# Patient Record
Sex: Female | Born: 1995
Health system: Southern US, Community
[De-identification: ages and names within clinical notes are randomized; demographics above are authoritative.]

## PROBLEM LIST (undated history)

## (undated) DIAGNOSIS — D573 Sickle-cell trait: Secondary | ICD-10-CM

---

## 2013-02-25 ENCOUNTER — Emergency Department (HOSPITAL_BASED_OUTPATIENT_CLINIC_OR_DEPARTMENT_OTHER)
Admission: EM | Admit: 2013-02-25 | Discharge: 2013-02-25 | Disposition: A | Discharge status: Home / Self Care | Payer: Medicaid Other | Attending: Emergency Medicine | Admitting: Emergency Medicine

## 2013-02-25 ENCOUNTER — Encounter (HOSPITAL_BASED_OUTPATIENT_CLINIC_OR_DEPARTMENT_OTHER): Payer: Self-pay | Admitting: Emergency Medicine

## 2013-02-25 DIAGNOSIS — Z862 Personal history of diseases of the blood and blood-forming organs and certain disorders involving the immune mechanism: Secondary | ICD-10-CM | POA: Insufficient documentation

## 2013-02-25 DIAGNOSIS — Z3202 Encounter for pregnancy test, result negative: Secondary | ICD-10-CM | POA: Insufficient documentation

## 2013-02-25 DIAGNOSIS — Z792 Long term (current) use of antibiotics: Secondary | ICD-10-CM | POA: Insufficient documentation

## 2013-02-25 DIAGNOSIS — N390 Urinary tract infection, site not specified: Secondary | ICD-10-CM | POA: Insufficient documentation

## 2013-02-25 DIAGNOSIS — F17200 Nicotine dependence, unspecified, uncomplicated: Secondary | ICD-10-CM | POA: Insufficient documentation

## 2013-02-25 HISTORY — DX: Sickle-cell trait: D57.3

## 2013-02-25 LAB — URINALYSIS, ROUTINE W REFLEX MICROSCOPIC
Bilirubin Urine: NEGATIVE
Nitrite: NEGATIVE
Specific Gravity, Urine: 1.016 (ref 1.005–1.030)
Urobilinogen, UA: 0.2 mg/dL (ref 0.0–1.0)
pH: 5.5 (ref 5.0–8.0)

## 2013-02-25 LAB — WET PREP, GENITAL
Clue Cells Wet Prep HPF POC: NONE SEEN
Trich, Wet Prep: NONE SEEN
Yeast Wet Prep HPF POC: NONE SEEN

## 2013-02-25 LAB — URINE MICROSCOPIC-ADD ON

## 2013-02-25 LAB — PREGNANCY, URINE: Preg Test, Ur: NEGATIVE

## 2013-02-25 MED ORDER — CEPHALEXIN 250 MG PO CAPS
250.0000 mg | ORAL_CAPSULE | Freq: Once | ORAL | Status: AC
  Administered 2013-02-25: 250 mg via ORAL
  Filled 2013-02-25: qty 1

## 2013-02-25 MED ORDER — CEPHALEXIN 500 MG PO CAPS: 500.0000 mg | ORAL_CAPSULE | Freq: Four times a day (QID) | ORAL | Status: DC

## 2013-02-25 NOTE — ED Notes (Signed)
PA at bedside.

## 2013-02-25 NOTE — ED Notes (Signed)
Pt reports she took Plan B last week and has developed vaginal itching and frequency with urination.

## 2013-02-25 NOTE — ED Provider Notes (Signed)
CSN: [id number]     Arrival date & time 02/25/13  1236 History   First MD Initiated Contact with Patient 02/25/13 1256     Chief Complaint  Patient presents with  . Vaginal Itching  . Urinary Frequency   (Consider location/radiation/quality/duration/timing/severity/associated sxs/prior Treatment) Patient is a 17 y.o. female presenting with vaginal itching and frequency. The history is provided by the patient. No language interpreter was used.  Vaginal Itching Pertinent negatives include no abdominal pain, chest pain, fever, nausea or vomiting. Associated symptoms comments: She has been having a burning type pain to the external vaginal area for 2-3 days. She had itching initially. She reports frequency of urination with burning also. No fever, nausea, abnormal vaginal bleeding or vaginal discharge today. She states she took a 'morning after' pill 4 days ago and had some vaginal discharge the following morning that resolved later that day. .  Urinary Frequency Pertinent negatives include no abdominal pain, chest pain, fever, nausea or vomiting.    Past Medical History  Diagnosis Date  . Sickle cell trait    History reviewed. No pertinent past surgical history. No family history on file. History  Substance Use Topics  . Smoking status: Current Every Day Smoker  . Smokeless tobacco: Not on file  . Alcohol Use: No   OB History   Grav Para Term Preterm Abortions TAB SAB Ect Mult Living                 Review of Systems  Constitutional: Negative for fever.  Respiratory: Negative for shortness of breath.   Cardiovascular: Negative for chest pain.  Gastrointestinal: Negative for nausea, vomiting and abdominal pain.  Genitourinary: Positive for dysuria, frequency and vaginal pain. Negative for vaginal bleeding.       See HPI.    Allergies  Review of patient's allergies indicates no known allergies.  Home Medications   Current Outpatient Rx  Name  Route  Sig  Dispense  Refill   . cephALEXin (KEFLEX) 500 MG capsule   Oral   Take 1 capsule (500 mg total) by mouth 4 (four) times daily.   20 capsule   0    BP 127/79  Pulse 77  Temp(Src) 98.3 F (36.8 C) (Oral)  Resp 18  Ht 5\' 6"  (1.676 m)  Wt 153 lb (69.4 kg)  BMI 24.71 kg/m2  SpO2 100%  LMP 02/10/2013 Physical Exam  Constitutional: She is oriented to person, place, and time. She appears well-developed and well-nourished.  HENT:  Head: Normocephalic.  Neck: Normal range of motion. Neck supple.  Cardiovascular: Normal rate and regular rhythm.   Pulmonary/Chest: Effort normal and breath sounds normal.  Abdominal: Soft. Bowel sounds are normal. There is no tenderness. There is no rebound and no guarding.  Genitourinary: Vagina normal and uterus normal. No vaginal discharge found.  Minimal vaginal discharge present that is white, thick. No CMT. No adnexal mass or tenderness.  Musculoskeletal: Normal range of motion.  Neurological: She is alert and oriented to person, place, and time.  Skin: Skin is warm and dry. No rash noted.  Psychiatric: She has a normal mood and affect.    ED Course  Procedures (including critical care time) Labs Review Labs Reviewed  WET PREP, GENITAL - Abnormal; Notable for the following:    WBC, Wet Prep HPF POC MANY (*)    All other components within normal limits  URINALYSIS, ROUTINE W REFLEX MICROSCOPIC - Abnormal; Notable for the following:    APPearance CLOUDY (*)  Leukocytes, UA LARGE (*)    All other components within normal limits  URINE MICROSCOPIC-ADD ON - Abnormal; Notable for the following:    Squamous Epithelial / LPF MANY (*)    Bacteria, UA MANY (*)    All other components within normal limits  GC/CHLAMYDIA PROBE AMP  URINE CULTURE  PREGNANCY, URINE   Results for orders placed during the hospital encounter of 02/25/13  WET PREP, GENITAL      Result Value Range   Yeast Wet Prep HPF POC NONE SEEN  NONE SEEN   Trich, Wet Prep NONE SEEN  NONE SEEN    Clue Cells Wet Prep HPF POC NONE SEEN  NONE SEEN   WBC, Wet Prep HPF POC MANY (*) NONE SEEN  PREGNANCY, URINE      Result Value Range   Preg Test, Ur NEGATIVE  NEGATIVE  URINALYSIS, ROUTINE W REFLEX MICROSCOPIC      Result Value Range   Color, Urine YELLOW  YELLOW   APPearance CLOUDY (*) CLEAR   Specific Gravity, Urine 1.016  1.005 - 1.030   pH 5.5  5.0 - 8.0   Glucose, UA NEGATIVE  NEGATIVE mg/dL   Hgb urine dipstick NEGATIVE  NEGATIVE   Bilirubin Urine NEGATIVE  NEGATIVE   Ketones, ur NEGATIVE  NEGATIVE mg/dL   Protein, ur NEGATIVE  NEGATIVE mg/dL   Urobilinogen, UA 0.2  0.0 - 1.0 mg/dL   Nitrite NEGATIVE  NEGATIVE   Leukocytes, UA LARGE (*) NEGATIVE  URINE MICROSCOPIC-ADD ON      Result Value Range   Squamous Epithelial / LPF MANY (*) RARE   WBC, UA 11-20  <3 WBC/hpf   RBC / HPF 0-2  <3 RBC/hpf   Bacteria, UA MANY (*) RARE    Imaging Review No results found.  EKG Interpretation   None       MDM   1. UTI (lower urinary tract infection)    She is well appearing, no significant finding on GYN exam with evidence of UTI. Cultures pending. Treated with Keflex.    Arnoldo Hooker, PA-C 02/25/13 1425

## 2013-02-26 LAB — URINE CULTURE
Colony Count: NO GROWTH
Culture: NO GROWTH

## 2013-02-26 LAB — GC/CHLAMYDIA PROBE AMP
CT Probe RNA: NEGATIVE
GC Probe RNA: NEGATIVE

## 2013-02-26 NOTE — ED Provider Notes (Signed)
Medical screening examination/treatment/procedure(s) were performed by non-physician practitioner and as supervising physician I was immediately available for consultation/collaboration.  EKG Interpretation   None         Charles B. Sheldon, MD 02/26/13 0704 

## 2013-12-16 ENCOUNTER — Encounter (HOSPITAL_BASED_OUTPATIENT_CLINIC_OR_DEPARTMENT_OTHER): Payer: Self-pay | Admitting: Emergency Medicine

## 2013-12-16 ENCOUNTER — Emergency Department (HOSPITAL_BASED_OUTPATIENT_CLINIC_OR_DEPARTMENT_OTHER)
Admission: EM | Admit: 2013-12-16 | Discharge: 2013-12-16 | Disposition: A | Discharge status: Home / Self Care | Payer: Medicaid Other | Attending: Emergency Medicine | Admitting: Emergency Medicine

## 2013-12-16 DIAGNOSIS — Z792 Long term (current) use of antibiotics: Secondary | ICD-10-CM | POA: Diagnosis not present

## 2013-12-16 DIAGNOSIS — Z3202 Encounter for pregnancy test, result negative: Secondary | ICD-10-CM | POA: Diagnosis not present

## 2013-12-16 DIAGNOSIS — R110 Nausea: Secondary | ICD-10-CM | POA: Diagnosis not present

## 2013-12-16 DIAGNOSIS — A5901 Trichomonal vulvovaginitis: Secondary | ICD-10-CM | POA: Insufficient documentation

## 2013-12-16 DIAGNOSIS — B9689 Other specified bacterial agents as the cause of diseases classified elsewhere: Secondary | ICD-10-CM

## 2013-12-16 DIAGNOSIS — A599 Trichomoniasis, unspecified: Secondary | ICD-10-CM

## 2013-12-16 DIAGNOSIS — Z862 Personal history of diseases of the blood and blood-forming organs and certain disorders involving the immune mechanism: Secondary | ICD-10-CM | POA: Diagnosis not present

## 2013-12-16 DIAGNOSIS — Z720 Tobacco use: Secondary | ICD-10-CM | POA: Insufficient documentation

## 2013-12-16 DIAGNOSIS — N760 Acute vaginitis: Secondary | ICD-10-CM | POA: Insufficient documentation

## 2013-12-16 DIAGNOSIS — R300 Dysuria: Secondary | ICD-10-CM | POA: Diagnosis present

## 2013-12-16 LAB — WET PREP, GENITAL: YEAST WET PREP: NONE SEEN

## 2013-12-16 LAB — URINALYSIS, ROUTINE W REFLEX MICROSCOPIC
Bilirubin Urine: NEGATIVE
Glucose, UA: NEGATIVE mg/dL
Hgb urine dipstick: NEGATIVE
KETONES UR: 15 mg/dL — AB
Nitrite: NEGATIVE
Protein, ur: NEGATIVE mg/dL
Specific Gravity, Urine: 1.019 (ref 1.005–1.030)
UROBILINOGEN UA: 2 mg/dL — AB (ref 0.0–1.0)
pH: 6 (ref 5.0–8.0)

## 2013-12-16 LAB — PREGNANCY, URINE: Preg Test, Ur: NEGATIVE

## 2013-12-16 LAB — URINE MICROSCOPIC-ADD ON

## 2013-12-16 MED ORDER — METRONIDAZOLE 500 MG PO TABS
2000.0000 mg | ORAL_TABLET | ORAL | Status: AC
  Administered 2013-12-16: 2000 mg via ORAL
  Filled 2013-12-16: qty 4

## 2013-12-16 NOTE — ED Notes (Signed)
NP at bedside discussing test results and dispo plan of care. 

## 2013-12-16 NOTE — ED Notes (Signed)
When EMT in to draw labs, pt states she has test for syphilis and HIV last week at [facility] notified-orders for same d/c'ed

## 2013-12-16 NOTE — ED Provider Notes (Signed)
CSN: [id number]     Arrival date & time 12/16/13  1801 History   First MD Initiated Contact with Patient 12/16/13 1847     Chief Complaint  Patient presents with  . Dysuria   (Consider location/radiation/quality/duration/timing/severity/associated sxs/prior Treatment) HPI Rebecca Delgado is a 18 yo female presenting with breast tenderness and abdominal cramping x 2 weeks.  She states she and her boyfriend are trying to get pregnant and thinks she might be.  Her LMP was 3 weeks ago.  She also states she gets frequent urinary tract infections and she usually has lower abdominal pain with these infections.  She endorses intermittent nausea but no vomiting.  She is currently not having any abd pain.  She denies fever, chills, back pain or dysuria at anytime.    Past Medical History  Diagnosis Date  . Sickle cell trait    History reviewed. No pertinent past surgical history. History reviewed. No pertinent family history. History  Substance Use Topics  . Smoking status: Current Every Day Smoker  . Smokeless tobacco: Not on file  . Alcohol Use: No   OB History   Grav Para Term Preterm Abortions TAB SAB Ect Mult Living                 Review of Systems  Constitutional: Negative for fever and chills.  HENT: Negative for sore throat.   Eyes: Negative for visual disturbance.  Respiratory: Negative for cough and shortness of breath.   Cardiovascular: Negative for chest pain and leg swelling.  Gastrointestinal: Positive for nausea and abdominal pain. Negative for vomiting and diarrhea.  Genitourinary: Negative for dysuria.  Musculoskeletal: Negative for myalgias.  Skin: Negative for rash.  Neurological: Negative for weakness, numbness and headaches.      Allergies  Review of patient's allergies indicates no known allergies.  Home Medications   Prior to Admission medications   Medication Sig Start Date End Date Taking? Authorizing Provider  cephALEXin (KEFLEX) 500 MG capsule  Take 1 capsule (500 mg total) by mouth 4 (four) times daily. 02/25/13   Shari A Upstill, PA-C   BP 120/76  Pulse 70  Temp(Src) 99.2 F (37.3 C)  Resp 16  Ht 5\' 6"  (1.676 m)  Wt 160 lb (72.576 kg)  BMI 25.84 kg/m2  SpO2 100%  LMP 11/27/2013 Physical Exam  Nursing note and vitals reviewed. Constitutional: She appears well-developed and well-nourished. No distress.  HENT:  Head: Normocephalic and atraumatic.  Mouth/Throat: Oropharynx is clear and moist. No oropharyngeal exudate.  Eyes: Conjunctivae are normal.  Neck: Neck supple. No thyromegaly present.  Cardiovascular: Normal rate, regular rhythm and intact distal pulses.   Pulmonary/Chest: Effort normal and breath sounds normal. No respiratory distress. She has no wheezes. She has no rales. She exhibits no tenderness.  Abdominal: Soft. She exhibits no distension and no mass. There is no tenderness. There is no rebound and no guarding.  Genitourinary: Cervix exhibits discharge. Cervix exhibits no motion tenderness. Right adnexum displays no tenderness. Left adnexum displays no tenderness.  Grayinsh-green frothy discharge  Musculoskeletal: She exhibits no tenderness.  Lymphadenopathy:    She has no cervical adenopathy.  Neurological: She is alert.  Skin: Skin is warm and dry. No rash noted. She is not diaphoretic.  Psychiatric: She has a normal mood and affect.    ED Course  Procedures (including critical care time) Labs Review Labs Reviewed  WET PREP, GENITAL - Abnormal; Notable for the following:    Trich, Wet Prep MANY (*)  Clue Cells Wet Prep HPF POC MANY (*)    WBC, Wet Prep HPF POC MANY (*)    All other components within normal limits  URINALYSIS, ROUTINE W REFLEX MICROSCOPIC - Abnormal; Notable for the following:    Color, Urine AMBER (*)    APPearance CLOUDY (*)    Ketones, ur 15 (*)    Urobilinogen, UA 2.0 (*)    Leukocytes, UA MODERATE (*)    All other components within normal limits  URINE MICROSCOPIC-ADD ON  - Abnormal; Notable for the following:    Squamous Epithelial / LPF FEW (*)    Bacteria, UA FEW (*)    All other components within normal limits  GC/CHLAMYDIA PROBE AMP  PREGNANCY, URINE    Imaging Review No results found.   EKG Interpretation None      MDM   Final diagnoses:  Trichimoniasis  Bacterial vaginosis   18 yo female presents with dysuria and tender breasts and thinks she may be pregnant. Upreg is negative. Discussed importance of using protection when sexually active. Pt understands that they have GC/Chlamydia cultures pending and that they will need to inform all sexual partners if results return positive. Pt not concerning for PID because hemodynamically stable and no cervical motion tenderness on pelvic exam. Pt has also been treated with flagyl for Trichimoniasis and Bacterial Vaginosis. Pt has been advised to not drink alcohol while on this medication.  Patient to be discharged with instructions to follow up with OBGYN.  Filed Vitals:   12/16/13 1810  BP: 120/76  Pulse: 70  Temp: 99.2 F (37.3 C)  Resp: 16  Height: 5\' 6"  (1.676 m)  Weight: 160 lb (72.576 kg)  SpO2: 100%   Meds given in ED:  Medications  metroNIDAZOLE (FLAGYL) tablet 2,000 mg (2,000 mg Oral Given 12/16/13 1951)    Discharge Medication List as of 12/16/2013  7:51 PM        Harle BattiestElizabeth Uzair Godley, NP 12/20/13 2006

## 2013-12-16 NOTE — ED Notes (Signed)
Pt c/o painful urination  X 2 days also reprots painful breast and abd cramping. LMP x 3 weeks ago

## 2013-12-16 NOTE — Discharge Instructions (Signed)
Please follow the directions provided. Be sure to followup with the primary care provider referral to ensure you're getting better. You were partner should be treated for the trichomonas infection. Don't hesitate to return for any new concerning or worsening symptoms.  SEEK MEDICAL CARE IF:  You still have symptoms after you finish your medicine.  You develop abdominal pain.  You have pain when you urinate.  You have bleeding after sexual intercourse.  You develop a rash.  Your medicine makes you sick or makes you throw up (vomit).   Emergency Department Resource Guide 1) Find a Doctor and Pay Out of Pocket Although you won't have to find out who is covered by your insurance plan, it is a good idea to ask around and get recommendations. You will then need to call the office and see if the doctor you have chosen will accept you as a new patient and what types of options they offer for patients who are self-pay. Some doctors offer discounts or will set up payment plans for their patients who do not have insurance, but you will need to ask so you aren't surprised when you get to your appointment.  2) Contact Your Local Health Department Not all health departments have doctors that can see patients for sick visits, but many do, so it is worth a call to see if yours does. If you don't know where your local health department is, you can check in your phone book. The CDC also has a tool to help you locate your state's health department, and many state websites also have listings of all of their local health departments.  3) Find a Walk-in Clinic If your illness is not likely to be very severe or complicated, you may want to try a walk in clinic. These are popping up all over the country in pharmacies, drugstores, and shopping centers. They're usually staffed by nurse practitioners or physician assistants that have been trained to treat common illnesses and complaints. They're usually fairly quick and  inexpensive. However, if you have serious medical issues or chronic medical problems, these are probably not your best option.  No Primary Care Doctor: - Call Health Connect at  [phone] - they can help you locate a primary care doctor that  accepts your insurance, provides certain services, etc. - Physician Referral Service- [phone]  Chronic Pain Problems: Organization         Address  Phone   Notes  [facility]  [phone] Patients need to be referred by their primary care doctor.   Medication Assistance: [phone]         Address  Phone   Notes  [facility] [address] [place], [place] [address] [phone] --Must be a resident of [facility] [place] -- Must have NO insurance coverage whatsoever (no Medicaid/ Medicare, etc.) -- The pt. MUST have a primary care doctor that directs their care regularly and follows them in the community   MedAssist  [phone]   [employer]  [phone]    Agencies that provide inexpensive medical care: Organization         Address  Phone   Notes  [facility]  [phone]   [facility]    [phone]   [facility] [address] [place], [place] [address] [phone]   [facility] [address], [place] [phone]   [facility]  [facility]    [phone]   [facility]    [phone]   Community Health and [place] [address], [place] Phone:  [phone], Fax:  [phone] Hours of Operation:  9 am - 6 pm, M-F.  Also accepts Medicaid/Medicare and self-pay.  [facility] for [place], [address], [place] Phone: [phone], Fax: [phone]. Hours of Operation:  8:30 am - 5:30 pm, M-F.  Also accepts Medicaid and self-pay.  [facility] [place] [address] [place] Phone: [phone]   [place], [place], [place] [phone], Ext. [phone] Mondays & Thursdays: 7-9 AM.  First 15 patients are seen on a first come, first serve basis.    [place] Providers:  Organization         Address  Phone   Notes  [facility] [address], [place] [phone] Also accepts self-pay patients.  [facility] [address], [place]  [phone]   [place], [address], [place] [phone]   [facility] [address] [place] [phone]   Lucianne Lei [address], [place]   [phone] Only accepts [place] Access [place] patients after they have their name applied to their card.   Self-Pay (no insurance) in [facility]:  Organization         Address  Phone   Notes  Sickle Cell Patients, [facility] [place] [phone]   [facility] [place] [phone]   [facility] [place]  [place], [place], [place] [phone]   [facility]/Dr. Osei-Bonsu  [address] [place] or [address], [place] [phone] Phone number for both [place] and [place] locations is the same.  [facility]gent [facility] and [facility] [address], [place] [phone]   [facility] [address], [place] or [address] [phone]   [facility] [address], [place] [phone], phone; [phone], fax Sees patients 1st and 3rd Saturday of every month.  Must not qualify for public or private insurance (i.e. Medicaid, Medicare, [place] Health Choice, Veterans' Benefits)  Household income should be no more than 200% of the poverty level The clinic cannot treat you if you are pregnant or  think you are pregnant  Sexually transmitted diseases are not treated at the clinic.    Dental Care: Organization         Address  Phone  Notes  [facility] [phone] Accepts children up to age 68 who are enrolled in [place] or [place]; pregnant women with a Medicaid card; and children who have applied for Medicaid or [place] Health Choice, but were declined, whose parents can pay a reduced fee at time of service.  [facility]  [address], [place] [phone] Accepts children up to age 33 who are enrolled in [place] or [place]; pregnant women with a Medicaid card; and children who have applied for Medicaid or [place], but were declined, whose  parents can pay a reduced fee at time of service.  [facility] Adult Dental Access PROGRAM  [place] [phone] Patients are seen by appointment only. Walk-ins are not accepted. [place] will see patients 44 years of age and older. Monday - Tuesday (8am-5pm) Most Wednesdays (8:30-5pm) $30 per visit, cash only  [facility] Adult Dental Access PROGRAM  [address], [facility] [phone] Patients are seen by appointment only. Walk-ins are not accepted. [place] will see patients 31 years of age and older. One Wednesday Evening (Monthly: Volunteer Based).  $30 per visit, cash only  [place]  [phone] for adults; Children under age 87, call Graduate Pediatric Dentistry at [phone]. Children aged 64-14, please call [phone] to request a pediatric application.  Dental services are provided in all areas of dental care including fillings, crowns and bridges, complete and partial dentures, implants, gum treatment, root canals, and extractions. Preventive care is also provided. Treatment is provided to both adults  and children. Patients are selected via a lottery and there is often a waiting list.   [facility] [address], [place]  [phone] [email or web address]   [facility] [address] [place], [place] [phone], Ext. [phone] Second and Fourth Thursday of each month, opens at 6:30 AM; Clinic ends at 9 AM.  Patients are seen on a first-come first-served basis, and a limited number are seen during each clinic.   [facility]  [address] Hillard Danker [place], [place] [phone]   Eligibility Requirements You must have lived in [place], [place], or [place] counties for at least the last three months.   You cannot be eligible for state or federal sponsored [employer], including [employer], [place], or [employer].   You generally cannot be eligible for healthcare insurance through your employer.    How to apply: Eligibility screenings are held every Tuesday and Wednesday afternoon from 1:00 pm until 4:00 pm. You do not need an appointment for the interview!  [facility] [address] [place], [place]   [place]  [place] Department  [place]  [phone]    Behavioral Health Resources in the Community: Intensive Outpatient Programs Organization         Address  Phone  Notes  [place]. [address], [place], [place] [phone]   [facility] [address] [place], [place]   ADS: Alcohol & Drug Svcs [address] [place], [place]   [place] [address]  [place], [place] or [phone]   Substance Abuse Resources Organization         Address  Phone  Notes  Alcohol and Drug Services  [phone]   [place]  [phone]   The [place]     Chinita Pester  [phone]   Residential & Outpatient Substance Abuse Program  [phone]   Psychological Services Organization         Address  Phone  Notes  [facility] [place]  [place]  [phone]   [place] [address], [place] or [phone]    Mobile Crisis Teams Organization         Address  Phone  Notes  Therapeutic Alternatives, Mobile  Crisis Care Unit  [phone]   [facility]ive Psychotherapeutic Services  [address] [place], [place]   [facility] [address] [place] [phone]    Self-Help/Support Groups Organization         Address  Phone             Notes  [place]. of [place] - variety of support groups  [place] Call for more information  Narcotics Anonymous (NA), [facility] [address], [employer] [place]  2 meetings at this location   Special educational needs teacher         Address  Phone  Notes  ASAP Residential Treatment [place],    [place]  [phone]   [facility]  [address], [place] [address], [place], [place]   [place], [place] [phone] Admissions: 8am-3pm M-F  Incentives Substance [place] [address],    [place], [place] [phone]   [facility] [address] [place], [place], [place]   [facility] [address],  [place], [place]   [facility]ight Programs - Intensive Outpatient [address]een [address], [place], [place]   [facility] [address].,  [place], [place] [phone] or [phone]   [facility]idential Treatment Services (RTS) [address] [place], [place] Accepts Medicaid  [facility] [place] [address]  [place] [phone] Substance Abuse/Addiction Treatment   [facility] Organization         Address  Phone  Notes  [facility]  [phone]   Domenic Schwab, PhD [address] Arlis [place], [place]   [phone] or [phone]   [facility] [place], [place] [phone]   [facility] [address] [place], [place] [phone] Insurance/Medicaid/sponsorship through [facility] and Families [address] [place]                                    [employer], [place] [phone] [place] [address], [place] [phone]    Dr. Adele Schilder  [phone]   [facility] Dept. 1) [address] [place] 2) [place] 3)  [place] [address], [place] [phone]  [phone]   [place] [phone] or [phone] (After Hours)

## 2013-12-17 LAB — GC/CHLAMYDIA PROBE AMP
CT Probe RNA: POSITIVE — AB
GC Probe RNA: NEGATIVE

## 2013-12-20 ENCOUNTER — Telehealth (HOSPITAL_BASED_OUTPATIENT_CLINIC_OR_DEPARTMENT_OTHER): Payer: Self-pay | Admitting: Emergency Medicine

## 2013-12-20 NOTE — ED Provider Notes (Signed)
Medical screening examination/treatment/procedure(s) were performed by non-physician practitioner and as supervising physician I was immediately available for consultation/collaboration.   EKG Interpretation None        Mckyla Deckman, MD 12/20/13 2334 

## 2013-12-20 NOTE — Telephone Encounter (Signed)
Post ED Visit - Positive Culture Follow-up: Successful Patient Follow-Up  Positive Chlamydia culture  [x]  Patient discharged without antimicrobial prescription and treatment is now indicated []  Organism is resistant to prescribed ED discharge antimicrobial []  Patient with positive blood cultures  Changes discussed with ED provider: Fayrene HelperBowie Tran PA New antibiotic prescription Zithromax one gram PO [facility] [phone] voicemail [facility] faxed  Contacted patient, date 12/20/13, time 1200 ID verified, Patient notified of positive Chlamydia culture and need for treatment.STD instruction provided, patient verbalized understanding. [phone].   Rebecca Delgado, Rebecca Delgado 12/20/2013, 12:06 PM

## 2014-04-03 ENCOUNTER — Encounter (HOSPITAL_BASED_OUTPATIENT_CLINIC_OR_DEPARTMENT_OTHER): Payer: Self-pay

## 2014-04-03 ENCOUNTER — Emergency Department (HOSPITAL_BASED_OUTPATIENT_CLINIC_OR_DEPARTMENT_OTHER)
Admission: EM | Admit: 2014-04-03 | Discharge: 2014-04-03 | Disposition: A | Discharge status: Home / Self Care | Payer: Medicaid Other | Attending: Emergency Medicine | Admitting: Emergency Medicine

## 2014-04-03 DIAGNOSIS — N898 Other specified noninflammatory disorders of vagina: Secondary | ICD-10-CM | POA: Insufficient documentation

## 2014-04-03 DIAGNOSIS — O23591 Infection of other part of genital tract in pregnancy, first trimester: Secondary | ICD-10-CM | POA: Insufficient documentation

## 2014-04-03 DIAGNOSIS — F17208 Nicotine dependence, unspecified, with other nicotine-induced disorders: Secondary | ICD-10-CM | POA: Diagnosis not present

## 2014-04-03 DIAGNOSIS — Z3A13 13 weeks gestation of pregnancy: Secondary | ICD-10-CM | POA: Insufficient documentation

## 2014-04-03 DIAGNOSIS — N760 Acute vaginitis: Secondary | ICD-10-CM

## 2014-04-03 DIAGNOSIS — O99331 Smoking (tobacco) complicating pregnancy, first trimester: Secondary | ICD-10-CM | POA: Diagnosis not present

## 2014-04-03 DIAGNOSIS — O9989 Other specified diseases and conditions complicating pregnancy, childbirth and the puerperium: Secondary | ICD-10-CM | POA: Diagnosis present

## 2014-04-03 DIAGNOSIS — Z711 Person with feared health complaint in whom no diagnosis is made: Secondary | ICD-10-CM

## 2014-04-03 DIAGNOSIS — B9689 Other specified bacterial agents as the cause of diseases classified elsewhere: Secondary | ICD-10-CM

## 2014-04-03 DIAGNOSIS — Z202 Contact with and (suspected) exposure to infections with a predominantly sexual mode of transmission: Secondary | ICD-10-CM | POA: Diagnosis not present

## 2014-04-03 LAB — URINALYSIS, ROUTINE W REFLEX MICROSCOPIC
Bilirubin Urine: NEGATIVE
Glucose, UA: NEGATIVE mg/dL
Hgb urine dipstick: NEGATIVE
Ketones, ur: NEGATIVE mg/dL
NITRITE: NEGATIVE
PROTEIN: NEGATIVE mg/dL
SPECIFIC GRAVITY, URINE: 1.015 (ref 1.005–1.030)
Urobilinogen, UA: 1 mg/dL (ref 0.0–1.0)
pH: 6 (ref 5.0–8.0)

## 2014-04-03 LAB — WET PREP, GENITAL
Trich, Wet Prep: NONE SEEN
Yeast Wet Prep HPF POC: NONE SEEN

## 2014-04-03 LAB — URINE MICROSCOPIC-ADD ON

## 2014-04-03 LAB — PREGNANCY, URINE: Preg Test, Ur: POSITIVE — AB

## 2014-04-03 MED ORDER — METRONIDAZOLE 500 MG PO TABS: 500.0000 mg | ORAL_TABLET | Freq: Two times a day (BID) | ORAL | Status: DC

## 2014-04-03 MED ORDER — AZITHROMYCIN 250 MG PO TABS
1000.0000 mg | ORAL_TABLET | Freq: Once | ORAL | Status: AC
Start: 2014-04-03 — End: 2014-04-03
  Administered 2014-04-03: 1000 mg via ORAL
  Filled 2014-04-03: qty 4

## 2014-04-03 MED ORDER — CEFTRIAXONE SODIUM 250 MG IJ SOLR
250.0000 mg | Freq: Once | INTRAMUSCULAR | Status: AC
  Administered 2014-04-03: 250 mg via INTRAMUSCULAR
  Filled 2014-04-03: qty 250

## 2014-04-03 NOTE — ED Notes (Signed)
Pt sts she is [redacted] weeks pregnant. Sts vaginal discharge x 2-3 days. Mild lower abdominal cramping. Reports discharge thick, cloudy discharge.

## 2014-04-03 NOTE — ED Provider Notes (Signed)
CSN: [id number]     Arrival date & time 04/03/14  1801 History   First MD Initiated Contact with Patient 04/03/14 1856     Chief Complaint  Patient presents with  . Vaginal Discharge     (Consider location/radiation/quality/duration/timing/severity/associated sxs/prior Treatment) HPI  Pt is a 18yo female [redacted] weeks pregnant, G1P0, presenting to ED with c/o mild lower abdominal cramping with vaginal discharge that is thick and cloudy, stated 2-3 days ago.  Cramping is 6/10 at worst. No pain medication PTA. Pt states she is concerned for STD.  Pt states she had a f/u OB/GYN appointment 03/11/14, states everything was good at that time.  Reports having unprotected intercourse since then.  Denies fever, chills, n/v/d. Denies urinary symptoms. No hx of ectopic pregnancies.  Denies vaginal bleeding.  Pt does states she was treated in December 2015 for trichomonas and gonorrhea.    Past Medical History  Diagnosis Date  . Sickle cell trait    History reviewed. No pertinent past surgical history. No family history on file. History  Substance Use Topics  . Smoking status: Current Every Day Smoker  . Smokeless tobacco: Not on file  . Alcohol Use: No   OB History    Gravida Para Term Preterm AB TAB SAB Ectopic Multiple Living   1              Review of Systems  Constitutional: Negative for fever and chills.  Respiratory: Negative for cough and shortness of breath.   Cardiovascular: Negative for chest pain and palpitations.  Gastrointestinal: Negative for nausea, vomiting, abdominal pain and diarrhea.  Genitourinary: Positive for vaginal discharge and pelvic pain ( mild cramping). Negative for dysuria, urgency, frequency, hematuria, vaginal bleeding, vaginal pain and menstrual problem.  Musculoskeletal: Negative for myalgias and back pain.  All other systems reviewed and are negative.     Allergies  Review of patient's allergies indicates no known allergies.  Home Medications   Prior to  Admission medications   Medication Sig Start Date End Date Taking? Authorizing Provider  metroNIDAZOLE (FLAGYL) 500 MG tablet Take 1 tablet (500 mg total) by mouth 2 (two) times daily. One po bid x 7 days 04/03/14   Junius Finner, PA-C   BP 117/58 mmHg  Pulse 78  Temp(Src) 99.6 F (37.6 C) (Oral)  Resp 18  SpO2 100%  LMP 11/27/2013 Physical Exam  Constitutional: She appears well-developed and well-nourished. No distress.  HENT:  Head: Normocephalic and atraumatic.  Eyes: Conjunctivae are normal. No scleral icterus.  Neck: Normal range of motion.  Cardiovascular: Normal rate, regular rhythm and normal heart sounds.   Pulmonary/Chest: Effort normal and breath sounds normal. No respiratory distress. She has no wheezes. She has no rales. She exhibits no tenderness.  Abdominal: Soft. Bowel sounds are normal. She exhibits no distension and no mass. There is no tenderness. There is no rebound and no guarding.  Genitourinary:  Chaperoned exam. Normal external genitalia.  Vaginal canal: copious amount of yellow-green watery discharge with small amount of thick white discharge. No vaginal bleeding. Cervix is closed. No CMT, adnexal tenderness, or masses.   Musculoskeletal: Normal range of motion.  Neurological: She is alert.  Skin: Skin is warm and dry. She is not diaphoretic.  Nursing note and vitals reviewed.   ED Course  Procedures (including critical care time) Labs Review Labs Reviewed  WET PREP, GENITAL - Abnormal; Notable for the following:    Clue Cells Wet Prep HPF POC MANY (*)    WBC,  Wet Prep HPF POC MODERATE (*)    All other components within normal limits  PREGNANCY, URINE - Abnormal; Notable for the following:    Preg Test, Ur POSITIVE (*)    All other components within normal limits  URINALYSIS, ROUTINE W REFLEX MICROSCOPIC - Abnormal; Notable for the following:    APPearance CLOUDY (*)    Leukocytes, UA LARGE (*)    All other components within normal limits  URINE  MICROSCOPIC-ADD ON - Abnormal; Notable for the following:    Squamous Epithelial / LPF FEW (*)    Bacteria, UA FEW (*)    All other components within normal limits  GC/CHLAMYDIA PROBE AMP ([place])    Imaging Review No results found.   EKG Interpretation None      MDM   Final diagnoses:  Bacterial vaginosis  Concern about STD in female without diagnosis    Pt is a [place]18yo female, [redacted] weeks pregnant, followed by an OB/GYN.  Hx of gonorrhea and trich in Dec 2015.  Due to recent hx of STD, will tx empirically.  Doubt ectopic pregnancy, ovarian torsion or TOA as abdomen is soft, non-tender. No CMT, adnexal tenderness or masses. No vaginal bleeding.   Wet prep: significant for "clue cells" will tx with flagyl. Advised to f/u with her OB/GYN for continued pre-natal care as well as recheck of symptoms and labs. Discussed refraining from intercourse for 7 days, practice safe sex, and have all partners tested and treated. Return precautions provided. Pt verbalized understanding and agreement with tx plan.    Junius Finnerrin O'Malley, PA-C 04/04/14 1609  Rolan BuccoMelanie Belfi, MD 04/04/14 [phone]

## 2014-04-03 NOTE — ED Notes (Signed)
Pelvic cart at Pts room 

## 2014-04-06 LAB — GC/CHLAMYDIA PROBE AMP (~~LOC~~) NOT AT ARMC
Chlamydia: NEGATIVE
Neisseria Gonorrhea: NEGATIVE

## 2014-05-31 ENCOUNTER — Emergency Department (HOSPITAL_BASED_OUTPATIENT_CLINIC_OR_DEPARTMENT_OTHER)
Admission: EM | Admit: 2014-05-31 | Discharge: 2014-05-31 | Discharge status: Against Medical Advice | Payer: Medicaid Other | Attending: Emergency Medicine | Admitting: Emergency Medicine

## 2014-05-31 ENCOUNTER — Encounter (HOSPITAL_BASED_OUTPATIENT_CLINIC_OR_DEPARTMENT_OTHER): Payer: Self-pay

## 2014-05-31 DIAGNOSIS — Z87891 Personal history of nicotine dependence: Secondary | ICD-10-CM | POA: Diagnosis not present

## 2014-05-31 DIAGNOSIS — N898 Other specified noninflammatory disorders of vagina: Secondary | ICD-10-CM | POA: Insufficient documentation

## 2014-05-31 DIAGNOSIS — L293 Anogenital pruritus, unspecified: Secondary | ICD-10-CM | POA: Insufficient documentation

## 2014-05-31 LAB — URINALYSIS, ROUTINE W REFLEX MICROSCOPIC
Bilirubin Urine: NEGATIVE
Glucose, UA: NEGATIVE mg/dL
HGB URINE DIPSTICK: NEGATIVE
Ketones, ur: NEGATIVE mg/dL
Nitrite: NEGATIVE
PROTEIN: NEGATIVE mg/dL
Specific Gravity, Urine: 1.013 (ref 1.005–1.030)
Urobilinogen, UA: 1 mg/dL (ref 0.0–1.0)
pH: 7 (ref 5.0–8.0)

## 2014-05-31 LAB — URINE MICROSCOPIC-ADD ON

## 2014-05-31 NOTE — ED Notes (Signed)
Call to room second attempt.

## 2014-05-31 NOTE — ED Notes (Signed)
Pt reports 2 days of vaginal discharge brownish in color, vaginal itching and malodor.  Denies pain.

## 2014-05-31 NOTE — ED Notes (Signed)
No answer from waiting room.

## 2014-05-31 NOTE — ED Notes (Signed)
Third call to room no answer. 

## 2015-01-22 ENCOUNTER — Emergency Department (HOSPITAL_BASED_OUTPATIENT_CLINIC_OR_DEPARTMENT_OTHER)
Admission: EM | Admit: 2015-01-22 | Discharge: 2015-01-22 | Disposition: A | Discharge status: Home / Self Care | Payer: Medicaid Other | Attending: Emergency Medicine | Admitting: Emergency Medicine

## 2015-01-22 ENCOUNTER — Emergency Department (HOSPITAL_BASED_OUTPATIENT_CLINIC_OR_DEPARTMENT_OTHER): Payer: Medicaid Other

## 2015-01-22 ENCOUNTER — Encounter (HOSPITAL_BASED_OUTPATIENT_CLINIC_OR_DEPARTMENT_OTHER): Payer: Self-pay

## 2015-01-22 DIAGNOSIS — R112 Nausea with vomiting, unspecified: Secondary | ICD-10-CM | POA: Insufficient documentation

## 2015-01-22 DIAGNOSIS — Z862 Personal history of diseases of the blood and blood-forming organs and certain disorders involving the immune mechanism: Secondary | ICD-10-CM | POA: Insufficient documentation

## 2015-01-22 DIAGNOSIS — R509 Fever, unspecified: Secondary | ICD-10-CM | POA: Insufficient documentation

## 2015-01-22 DIAGNOSIS — R05 Cough: Secondary | ICD-10-CM

## 2015-01-22 DIAGNOSIS — Z87891 Personal history of nicotine dependence: Secondary | ICD-10-CM | POA: Insufficient documentation

## 2015-01-22 DIAGNOSIS — R059 Cough, unspecified: Secondary | ICD-10-CM

## 2015-01-22 MED ORDER — ONDANSETRON 4 MG PO TBDP: ORAL_TABLET | ORAL | Status: DC

## 2015-01-22 NOTE — ED Notes (Signed)
nonprod cough x 2 weeks-NAD-steady gait

## 2015-01-22 NOTE — Discharge Instructions (Signed)

## 2015-01-22 NOTE — ED Provider Notes (Signed)
CSN: [id number]     Arrival date & time 01/22/15  2102 History  By signing my name below, I, Budd Palmer, attest that this documentation has been prepared under the direction and in the presence of Mirian Mo, MD. Electronically Signed: Budd Palmer, ED Scribe. 01/22/2015. 10:12 PM.      Chief Complaint  Patient presents with  . Cough   Patient is a 19 y.o. female presenting with cough. The history is provided by the patient. No language interpreter was used.  Cough Cough characteristics:  Non-productive Severity:  Moderate Onset quality:  Gradual Duration:  2 weeks Timing:  Intermittent Progression:  Unchanged Chronicity:  New Ineffective treatments:  Cough suppressants Associated symptoms: fever    HPI Comments: Rebecca Delgado is a 19 y.o. female former smoker with a PMHx of sickle cell trait who presents to the Emergency Department complaining of cough onset 2 over weeks ago. She reports associated post-tussive emesis and fever (Tmax 100.5). She states she has not seen anyone for this. She notes she has been taking Nyquil with mild relief. Pt denies diarrhea.   Past Medical History  Diagnosis Date  . Sickle cell trait (HCC)    History reviewed. No pertinent past surgical history. No family history on file. Social History  Substance Use Topics  . Smoking status: Former Games developer  . Smokeless tobacco: None  . Alcohol Use: No   OB History    Gravida Para Term Preterm AB TAB SAB Ectopic Multiple Living   1              Review of Systems  Constitutional: Positive for fever.  Respiratory: Positive for cough.   Gastrointestinal: Positive for nausea and vomiting. Negative for diarrhea.  All other systems reviewed and are negative.   Allergies  Review of patient's allergies indicates no known allergies.  Home Medications   Prior to Admission medications   Medication Sig Start Date End Date Taking? Authorizing Provider  ondansetron (ZOFRAN ODT) 4 MG  disintegrating tablet  ODT q4 hours prn nausea/vomit 01/22/15   Mirian Mo, MD   BP 119/77 mmHg  Pulse 98  Temp(Src) 100.8 F (38.2 C) (Oral)  Resp 16  Ht  (1.676 m)  Wt 149 lb (67.586 kg)  BMI 24.06 kg/m2  SpO2 100%  Breastfeeding? Unknown Physical Exam  Constitutional: She is oriented to person, place, and time. She appears well-developed and well-nourished.  HENT:  Head: Normocephalic and atraumatic.  Right Ear: External ear normal.  Left Ear: External ear normal.  Eyes: Conjunctivae and EOM are normal. Pupils are equal, round, and reactive to light.  Neck: Normal range of motion. Neck supple.  Cardiovascular: Normal rate, regular rhythm, normal heart sounds and intact distal pulses.   Pulmonary/Chest: Effort normal and breath sounds normal.  Abdominal: Soft. Bowel sounds are normal. There is no tenderness.  Musculoskeletal: Normal range of motion.  Neurological: She is alert and oriented to person, place, and time.  Skin: Skin is warm and dry.  Vitals reviewed.   ED Course  Procedures  DIAGNOSTIC STUDIES: Oxygen Saturation is 100% on RA, normal by my interpretation.    COORDINATION OF CARE: 9:45 PM - Discussed plans to order a chest XR to r/o PNA. Pt advised of plan for treatment and pt agrees.  Labs Review Labs Reviewed - No data to display  Imaging Review Dg Chest 2 View  01/22/2015  CLINICAL DATA:  Cough. Former smoker with history of sickle cell trait. Cough with onset over 2  weeks ago. Emesis and fever. EXAM: CHEST  2 VIEW COMPARISON:  None. FINDINGS: The heart size and mediastinal contours are within normal limits. Both lungs are clear. The visualized skeletal structures are unremarkable. IMPRESSION: No active cardiopulmonary disease. Electronically Signed   By: Burman NievesWilliam  Stevens M.D.   On: 01/22/2015 22:16   I have personally reviewed and evaluated these images and lab results as part of my medical decision-making.   EKG Interpretation None       MDM   Final diagnoses:  Cough    19 y.o. female without pertinent PMH presents with cough as above.  Well appearing, stable vitals.  Benign exam.  Dorna BloomWu unremarkable.  Prescribed zofran for nausea.  DC home in stable condition.    I have reviewed all laboratory and imaging studies if ordered as above  1. Cough           Mirian MoMatthew Gentry, MD 01/23/15 [phone]

## 2015-04-05 ENCOUNTER — Emergency Department (HOSPITAL_BASED_OUTPATIENT_CLINIC_OR_DEPARTMENT_OTHER)
Admission: EM | Admit: 2015-04-05 | Discharge: 2015-04-05 | Disposition: A | Discharge status: Home / Self Care | Payer: Medicaid Other | Attending: Emergency Medicine | Admitting: Emergency Medicine

## 2015-04-05 ENCOUNTER — Encounter (HOSPITAL_BASED_OUTPATIENT_CLINIC_OR_DEPARTMENT_OTHER): Payer: Self-pay

## 2015-04-05 DIAGNOSIS — Z862 Personal history of diseases of the blood and blood-forming organs and certain disorders involving the immune mechanism: Secondary | ICD-10-CM | POA: Insufficient documentation

## 2015-04-05 DIAGNOSIS — N939 Abnormal uterine and vaginal bleeding, unspecified: Secondary | ICD-10-CM | POA: Insufficient documentation

## 2015-04-05 DIAGNOSIS — Z3202 Encounter for pregnancy test, result negative: Secondary | ICD-10-CM | POA: Insufficient documentation

## 2015-04-05 DIAGNOSIS — Z87891 Personal history of nicotine dependence: Secondary | ICD-10-CM | POA: Insufficient documentation

## 2015-04-05 DIAGNOSIS — N898 Other specified noninflammatory disorders of vagina: Secondary | ICD-10-CM

## 2015-04-05 LAB — URINALYSIS, ROUTINE W REFLEX MICROSCOPIC
Bilirubin Urine: NEGATIVE
GLUCOSE, UA: NEGATIVE mg/dL
Ketones, ur: NEGATIVE mg/dL
Nitrite: NEGATIVE
Protein, ur: NEGATIVE mg/dL
SPECIFIC GRAVITY, URINE: 1.014 (ref 1.005–1.030)
pH: 6 (ref 5.0–8.0)

## 2015-04-05 LAB — URINE MICROSCOPIC-ADD ON

## 2015-04-05 LAB — WET PREP, GENITAL
Sperm: NONE SEEN
TRICH WET PREP: NONE SEEN

## 2015-04-05 LAB — PREGNANCY, URINE: Preg Test, Ur: NEGATIVE

## 2015-04-05 MED ORDER — METRONIDAZOLE 500 MG PO TABS: 500.0000 mg | ORAL_TABLET | Freq: Two times a day (BID) | ORAL | Status: DC

## 2015-04-05 MED ORDER — CEFTRIAXONE SODIUM 250 MG IJ SOLR
250.0000 mg | Freq: Once | INTRAMUSCULAR | Status: AC
  Administered 2015-04-05: 250 mg via INTRAMUSCULAR
  Filled 2015-04-05: qty 250

## 2015-04-05 MED ORDER — AZITHROMYCIN 250 MG PO TABS
1000.0000 mg | ORAL_TABLET | Freq: Once | ORAL | Status: AC
  Administered 2015-04-05: 1000 mg via ORAL
  Filled 2015-04-05: qty 4

## 2015-04-05 MED ORDER — LIDOCAINE HCL (PF) 1 % IJ SOLN
INTRAMUSCULAR | Status: AC
  Administered 2015-04-05: 1.2 mL
  Filled 2015-04-05: qty 5

## 2015-04-05 MED ORDER — FLUCONAZOLE 50 MG PO TABS
150.0000 mg | ORAL_TABLET | Freq: Once | ORAL | Status: AC
  Administered 2015-04-05: 150 mg via ORAL
  Filled 2015-04-05: qty 1

## 2015-04-05 NOTE — ED Provider Notes (Signed)
CSN: [id number]     Arrival date & time 04/05/15  1811 History   First MD Initiated Contact with Patient 04/05/15 1951     Chief Complaint  Patient presents with  . Vaginal Discharge     (Consider location/radiation/quality/duration/timing/severity/associated sxs/prior Treatment) HPI Taylore Hinde is a 20 y.o. female who comes in for evaluation of vaginal discharge. Patient reports she has had vaginal discharge for the past 2 days. She reports it is white and thick. She reports recent unprotected sex with a new sexual partner. Reports associated vaginal itching. She denies any fevers, chills, nausea or vomiting, abdominal pain, urinary symptoms, back pain, pelvic pain. She does report she just started her menses. Nothing makes the problem better or worse. No other modifying factors.  Past Medical History  Diagnosis Date  . Sickle cell trait (HCC)    History reviewed. No pertinent past surgical history. No family history on file. Social History  Substance Use Topics  . Smoking status: Former Games developer  . Smokeless tobacco: None  . Alcohol Use: No   OB History    Gravida Para Term Preterm AB TAB SAB Ectopic Multiple Living   1              Review of Systems A 10 point review of systems was completed and was negative except for pertinent positives and negatives as mentioned in the history of present illness     Allergies  Review of patient's allergies indicates no known allergies.  Home Medications   Prior to Admission medications   Medication Sig Start Date End Date Taking? Authorizing Provider  metroNIDAZOLE (FLAGYL) 500 MG tablet Take 1 tablet (500 mg total) by mouth 2 (two) times daily. 04/05/15   Joycie Peek, PA-C   BP 113/77 mmHg  Pulse 74  Temp(Src) 98.4 F (36.9 C) (Oral)  Resp 18  Ht  (1.676 m)  Wt 65.318 kg  BMI 23.25 kg/m2  SpO2 100%  LMP 04/05/2015 Physical Exam  Constitutional: She is oriented to person, place, and time. She appears  well-developed and well-nourished.  HENT:  Head: Normocephalic and atraumatic.  Mouth/Throat: Oropharynx is clear and moist.  Eyes: Conjunctivae are normal. Pupils are equal, round, and reactive to light. Right eye exhibits no discharge. Left eye exhibits no discharge. No scleral icterus.  Neck: Neck supple.  Cardiovascular: Normal rate, regular rhythm and normal heart sounds.   Pulmonary/Chest: Effort normal and breath sounds normal. No respiratory distress. She has no wheezes. She has no rales.  Abdominal: Soft. There is no tenderness.  Genitourinary:  Chaperone was present for the entire genital exam. No lesions or rashes appreciated on vulva. Cervix visualized on speculum exam and appropriate cultures sampled. Moderate blood in vaginal vault. Discharge: None appreciated Upon bi manual exam- No TTP of the adnexa, no cervical motion tenderness. No fullness or masses appreciated. No abnormalities appreciated in structural anatomy.   Musculoskeletal: She exhibits no tenderness.  Neurological: She is alert and oriented to person, place, and time.  Cranial Nerves II-XII grossly intact  Skin: Skin is warm and dry. No rash noted.  Psychiatric: She has a normal mood and affect.  Nursing note and vitals reviewed.   ED Course  Procedures (including critical care time) Labs Review Labs Reviewed  WET PREP, GENITAL - Abnormal; Notable for the following:    Yeast Wet Prep HPF POC PRESENT (*)    Clue Cells Wet Prep HPF POC PRESENT (*)    WBC, Wet Prep HPF POC MODERATE (*)  All other components within normal limits  URINALYSIS, ROUTINE W REFLEX MICROSCOPIC (NOT AT [facility]) - Abnormal; Notable for the following:    Hgb urine dipstick LARGE (*)    Leukocytes, UA TRACE (*)    All other components within normal limits  URINE MICROSCOPIC-ADD ON - Abnormal; Notable for the following:    Squamous Epithelial / LPF 0-5 (*)    Bacteria, UA RARE (*)    All other components within normal limits   PREGNANCY, URINE  RPR  HIV ANTIBODY (ROUTINE TESTING)  GC/CHLAMYDIA PROBE AMP ([place]) NOT AT [facility]    Imaging Review No results found. I have personally reviewed and evaluated these images and lab results as part of my medical decision-making.   EKG Interpretation None     Meds given in ED:  Medications  cefTRIAXone (ROCEPHIN) injection 250 mg (250 mg Intramuscular Given 04/05/15 2033)  azithromycin (ZITHROMAX) tablet 1,000 mg (1,000 mg Oral Given 04/05/15 2033)  lidocaine (PF) (XYLOCAINE) 1 % injection (1.2 mLs  Given 04/05/15 2034)  fluconazole (DIFLUCAN) tablet 150 mg (150 mg Oral Given 04/05/15 2108)    Discharge Medication List as of 04/05/2015  8:51 PM    START taking these medications   Details  metroNIDAZOLE (FLAGYL) 500 MG tablet Take 1 tablet (500 mg total) by mouth 2 (two) times daily., Starting 04/05/2015, Until Discontinued, Print       Filed Vitals:   04/05/15 1820 04/05/15 2049  BP: 118/74 113/77  Pulse: 78 74  Temp: 98.4 F (36.9 C) 98.4 F (36.9 C)  TempSrc: Oral Oral  Resp: 16 18  Height:  (1.676 m)   Weight: 65.318 kg   SpO2: 100% 100%    MDM  Patient to be discharged with instructions to follow up with OBGYN. Discussed importance of using protection when sexually active. Pt understands that they have GC/Chlamydia cultures pending and that they will need to inform all sexual partners if results return positive. Pt has been treated prophylacticly with azithromycin and rocephin due to pts history, pelvic exam, and wet prep with increased WBCs. Also treated yeast infection. Pt not concerning for PID because hemodynamically stable and no cervical motion tenderness on pelvic exam. Pt has also been treated with flagyl for Bacterial Vaginosis. Pt has been advised to not drink alcohol while on this medication.   Final diagnoses:  Vaginal discharge        Joycie Peek, PA-C 04/06/15 1454  Nelva Nay, MD 04/07/15 [phone]

## 2015-04-05 NOTE — ED Notes (Signed)
Pt describes cloudy vaginal d/c x 2 days. +itching. Sexually active. No birth control.

## 2015-04-05 NOTE — Discharge Instructions (Signed)
You were treated empirically in the emergency department for sexually transmitted infection. You will also be treated for BV with metronidazole. You were treated for a yeast infection with a one-time dose of antifungal in the emergency department. Please follow-up with your doctor as needed for reevaluation. Return to ED for any new or worsening symptoms. It is important for you to inform your partners that you are treated for STI and they will need to be evaluated and treated also.

## 2015-04-05 NOTE — ED Notes (Signed)
C/o vaginal d/c and itching x 2 days-NAD-steady gait

## 2015-04-06 LAB — GC/CHLAMYDIA PROBE AMP (~~LOC~~) NOT AT ARMC
CHLAMYDIA, DNA PROBE: NEGATIVE
Neisseria Gonorrhea: NEGATIVE

## 2015-04-12 LAB — HIV ANTIBODY (ROUTINE TESTING W REFLEX): HIV Screen 4th Generation wRfx: NONREACTIVE

## 2015-04-22 ENCOUNTER — Encounter (HOSPITAL_BASED_OUTPATIENT_CLINIC_OR_DEPARTMENT_OTHER): Payer: Self-pay | Admitting: *Deleted

## 2015-04-22 ENCOUNTER — Emergency Department (HOSPITAL_BASED_OUTPATIENT_CLINIC_OR_DEPARTMENT_OTHER)
Admission: EM | Admit: 2015-04-22 | Discharge: 2015-04-22 | Disposition: A | Discharge status: Home / Self Care | Payer: Medicaid Other | Attending: Emergency Medicine | Admitting: Emergency Medicine

## 2015-04-22 DIAGNOSIS — Z87891 Personal history of nicotine dependence: Secondary | ICD-10-CM | POA: Insufficient documentation

## 2015-04-22 DIAGNOSIS — N760 Acute vaginitis: Secondary | ICD-10-CM | POA: Insufficient documentation

## 2015-04-22 DIAGNOSIS — Z862 Personal history of diseases of the blood and blood-forming organs and certain disorders involving the immune mechanism: Secondary | ICD-10-CM | POA: Insufficient documentation

## 2015-04-22 DIAGNOSIS — N898 Other specified noninflammatory disorders of vagina: Secondary | ICD-10-CM

## 2015-04-22 DIAGNOSIS — Z3202 Encounter for pregnancy test, result negative: Secondary | ICD-10-CM | POA: Insufficient documentation

## 2015-04-22 DIAGNOSIS — B9689 Other specified bacterial agents as the cause of diseases classified elsewhere: Secondary | ICD-10-CM

## 2015-04-22 DIAGNOSIS — Z792 Long term (current) use of antibiotics: Secondary | ICD-10-CM | POA: Insufficient documentation

## 2015-04-22 LAB — URINALYSIS, ROUTINE W REFLEX MICROSCOPIC
Bilirubin Urine: NEGATIVE
GLUCOSE, UA: NEGATIVE mg/dL
Hgb urine dipstick: NEGATIVE
Ketones, ur: NEGATIVE mg/dL
Nitrite: NEGATIVE
Protein, ur: NEGATIVE mg/dL
SPECIFIC GRAVITY, URINE: 1.014 (ref 1.005–1.030)
pH: 7 (ref 5.0–8.0)

## 2015-04-22 LAB — URINE MICROSCOPIC-ADD ON

## 2015-04-22 LAB — PREGNANCY, URINE: Preg Test, Ur: NEGATIVE

## 2015-04-22 MED ORDER — METRONIDAZOLE 500 MG PO TABS
500.0000 mg | ORAL_TABLET | Freq: Two times a day (BID) | ORAL | Status: DC
Start: 2015-04-22 — End: 2015-09-17

## 2015-04-22 NOTE — ED Provider Notes (Signed)
CSN: [id number]     Arrival date & time 04/22/15  1806 History   First MD Initiated Contact with Patient 04/22/15 1833     Chief Complaint  Patient presents with  . Vaginal Discharge     (Consider location/radiation/quality/duration/timing/severity/associated sxs/prior Treatment) Patient is a 20 y.o. female presenting with vaginal discharge. The history is provided by the patient and medical records.  Vaginal Discharge   20 year old female with history of sickle cell trait, presenting to the ED for vaginal discharge for the past 2 days. Patient was seen here a few weeks ago and diagnosed with BV. She states she had testing done for HIV, gonorrhea, and chlamydia. She states she was given prescriptions, however when she took it to the pharmacy they told her it had expired and she would need a new prescription. She states she began having vaginal discharge again 2 days which was exactly like it was before. States there is somewhat of a fish odor.   She denies any sexual activity since last pelvic exam. No abdominal pain, pelvic pain, nausea, vomiting, fever, or rash. She has no history of STD. She denies any urinary symptoms.  VSS.  Past Medical History  Diagnosis Date  . Sickle cell trait (HCC)    History reviewed. No pertinent past surgical history. No family history on file. Social History  Substance Use Topics  . Smoking status: Former Games developer  . Smokeless tobacco: None  . Alcohol Use: No   OB History    Gravida Para Term Preterm AB TAB SAB Ectopic Multiple Living   1              Review of Systems  Genitourinary: Positive for vaginal discharge.  All other systems reviewed and are negative.     Allergies  Review of patient's allergies indicates no known allergies.  Home Medications   Prior to Admission medications   Medication Sig Start Date End Date Taking? Authorizing Provider  metroNIDAZOLE (FLAGYL) 500 MG tablet Take 1 tablet (500 mg total) by mouth 2 (two) times  daily. 04/05/15   Joycie Peek, PA-C   BP 115/69 mmHg  Pulse 81  Temp(Src) 99.7 F (37.6 C) (Oral)  Resp 20  Ht  (1.676 m)  Wt 65.318 kg  BMI 23.25 kg/m2  SpO2 100%  LMP 04/05/2015   Physical Exam  Constitutional: She is oriented to person, place, and time. She appears well-developed and well-nourished. No distress.  HENT:  Head: Normocephalic and atraumatic.  Mouth/Throat: Oropharynx is clear and moist.  Eyes: Conjunctivae and EOM are normal. Pupils are equal, round, and reactive to light.  Neck: Normal range of motion. Neck supple.  Cardiovascular: Normal rate, regular rhythm and normal heart sounds.   Pulmonary/Chest: Effort normal and breath sounds normal. No respiratory distress. She has no wheezes.  Abdominal: Soft. Bowel sounds are normal. There is no tenderness. There is no guarding.  Musculoskeletal: Normal range of motion. She exhibits no edema.  Neurological: She is alert and oriented to person, place, and time.  Skin: Skin is warm and dry. She is not diaphoretic.  Psychiatric: She has a normal mood and affect.  Nursing note and vitals reviewed.   ED Course  Procedures (including critical care time) Labs Review Labs Reviewed  URINALYSIS, ROUTINE W REFLEX MICROSCOPIC (NOT AT [facility]) - Abnormal; Notable for the following:    Leukocytes, UA SMALL (*)    All other components within normal limits  URINE MICROSCOPIC-ADD ON - Abnormal; Notable for the following:  Squamous Epithelial / LPF 0-5 (*)    Bacteria, UA RARE (*)    All other components within normal limits  PREGNANCY, URINE    Imaging Review No results found. I have personally reviewed and evaluated these images and lab results as part of my medical decision-making.   EKG Interpretation None      MDM   Final diagnoses:  Vaginal discharge  BV (bacterial vaginosis)   20 year old female here with vaginal discharge. Seen here a few weeks ago for the same, given prescription for Flagyl but she  was not able to get this filled. She reports recurrence of  Same symptoms again. She denies any sexual activity since last pelvic exam.   I have reviewed her prior cultures-- HIV, gc/chl cultures all negative.   Her wet prep was positive for clue cells.   Urinalysis today with rare bacteria, no significant signs of infection. Urine pregnancy test is negative. Patient does not want  another pelvic exam, she would rather have another prescription for the metronidazole. Given she has not been sexually active since last exam, had negative cultures, and denies any new symptoms, I feel this is reasonable.   Recommended to follow-up with OB/GYN if she continues having issues.  Discussed plan with patient, he/she acknowledged understanding and agreed with plan of care.  Return precautions given for new or worsening symptoms.  Garlon Hatchet, PA-C 04/22/15 1857  Rolland Porter, MD 04/30/15 [phone]

## 2015-04-22 NOTE — ED Notes (Addendum)
Vaginal discharge and itching x 2 days. States she was diagnosed with BV 2 weeks ago. She lost her RX and did not take her medication.

## 2015-04-22 NOTE — Discharge Instructions (Signed)
Take the prescribed medication as directed.  Do not drink alcohol while taking this medication, it will make you sick. Follow-up with an OB-GYN or womens clinic if you continue having issues. Return to the ED for new or worsening symptoms.  Bacterial Vaginosis Bacterial vaginosis is a vaginal infection that occurs when the normal balance of bacteria in the vagina is disrupted. It results from an overgrowth of certain bacteria. This is the most common vaginal infection in women of childbearing age. Treatment is important to prevent complications, especially in pregnant women, as it can cause a premature delivery. CAUSES  Bacterial vaginosis is caused by an increase in harmful bacteria that are normally present in smaller amounts in the vagina. Several different kinds of bacteria can cause bacterial vaginosis. However, the reason that the condition develops is not fully understood. RISK FACTORS Certain activities or behaviors can put you at an increased risk of developing bacterial vaginosis, including:  Having a new sex partner or multiple sex partners.  Douching.  Using an intrauterine device (IUD) for contraception. Women do not get bacterial vaginosis from toilet seats, bedding, swimming pools, or contact with objects around them. SIGNS AND SYMPTOMS  Some women with bacterial vaginosis have no signs or symptoms. Common symptoms include:  Grey vaginal discharge.  A fishlike odor with discharge, especially after sexual intercourse.  Itching or burning of the vagina and vulva.  Burning or pain with urination. DIAGNOSIS  Your health care provider will take a medical history and examine the vagina for signs of bacterial vaginosis. A sample of vaginal fluid may be taken. Your health care provider will look at this sample under a microscope to check for bacteria and abnormal cells. A vaginal pH test may also be done.  TREATMENT  Bacterial vaginosis may be treated with antibiotic medicines.  These may be given in the form of a pill or a vaginal cream. A second round of antibiotics may be prescribed if the condition comes back after treatment. Because bacterial vaginosis increases your risk for sexually transmitted diseases, getting treated can help reduce your risk for chlamydia, gonorrhea, HIV, and herpes. HOME CARE INSTRUCTIONS   Only take over-the-counter or prescription medicines as directed by your health care provider.  If antibiotic medicine was prescribed, take it as directed. Make sure you finish it even if you start to feel better.  Tell all sexual partners that you have a vaginal infection. They should see their health care provider and be treated if they have problems, such as a mild rash or itching.  During treatment, it is important that you follow these instructions:  Avoid sexual activity or use condoms correctly.  Do not douche.  Avoid alcohol as directed by your health care provider.  Avoid breastfeeding as directed by your health care provider. SEEK MEDICAL CARE IF:   Your symptoms are not improving after 3 days of treatment.  You have increased discharge or pain.  You have a fever. MAKE SURE YOU:   Understand these instructions.  Will watch your condition.  Will get help right away if you are not doing well or get worse. FOR MORE INFORMATION  Centers for Disease Control and Prevention, Division of STD Prevention: [employer] American Sexual Health Association (ASHA): www.ashastd.org    This information is not intended to replace advice given to you by your health care provider. Make sure you discuss any questions you have with your health care provider.   Document Released: 02/13/2005 Document Revised: 03/06/2014 Document Reviewed: 09/25/2012 Elsevier Interactive  Patient Education 2016 [employer].

## 2015-09-17 ENCOUNTER — Emergency Department (HOSPITAL_BASED_OUTPATIENT_CLINIC_OR_DEPARTMENT_OTHER)
Admission: EM | Admit: 2015-09-17 | Discharge: 2015-09-17 | Disposition: A | Discharge status: Home / Self Care | Payer: Medicaid Other | Attending: Emergency Medicine | Admitting: Emergency Medicine

## 2015-09-17 ENCOUNTER — Encounter (HOSPITAL_BASED_OUTPATIENT_CLINIC_OR_DEPARTMENT_OTHER): Payer: Self-pay | Admitting: *Deleted

## 2015-09-17 DIAGNOSIS — B9689 Other specified bacterial agents as the cause of diseases classified elsewhere: Secondary | ICD-10-CM

## 2015-09-17 DIAGNOSIS — N760 Acute vaginitis: Secondary | ICD-10-CM | POA: Insufficient documentation

## 2015-09-17 DIAGNOSIS — Z87891 Personal history of nicotine dependence: Secondary | ICD-10-CM | POA: Insufficient documentation

## 2015-09-17 LAB — URINE MICROSCOPIC-ADD ON: RBC / HPF: NONE SEEN RBC/hpf (ref 0–5)

## 2015-09-17 LAB — WET PREP, GENITAL
SPERM: NONE SEEN
TRICH WET PREP: NONE SEEN
Yeast Wet Prep HPF POC: NONE SEEN

## 2015-09-17 LAB — URINALYSIS, ROUTINE W REFLEX MICROSCOPIC
BILIRUBIN URINE: NEGATIVE
Glucose, UA: NEGATIVE mg/dL
HGB URINE DIPSTICK: NEGATIVE
Ketones, ur: NEGATIVE mg/dL
Nitrite: NEGATIVE
PH: 6.5 (ref 5.0–8.0)
Protein, ur: NEGATIVE mg/dL
SPECIFIC GRAVITY, URINE: 1.016 (ref 1.005–1.030)

## 2015-09-17 LAB — PREGNANCY, URINE: Preg Test, Ur: NEGATIVE

## 2015-09-17 MED ORDER — METRONIDAZOLE 500 MG PO TABS
2000.0000 mg | ORAL_TABLET | Freq: Once | ORAL | Status: AC
  Administered 2015-09-17: 2000 mg via ORAL
  Filled 2015-09-17: qty 4

## 2015-09-17 NOTE — ED Notes (Signed)
Pa  at bedside. 

## 2015-09-17 NOTE — Discharge Instructions (Signed)
Please read and follow all provided instructions.  Your diagnoses today include:  1. Bacterial vaginosis    Tests performed today include:  Vital signs. See below for your results today.   Medications prescribed:   Take as prescribed   Home care instructions:  Follow any educational materials contained in this packet.  Follow-up instructions: Please follow-up with your primary care provider for further evaluation of symptoms and treatment   Return instructions:   Please return to the Emergency Department if you do not get better, if you get worse, or new symptoms OR  - Fever (temperature greater than 101.77F)  - Bleeding that does not stop with holding pressure to the area    -Severe pain (please note that you may be more sore the day after your accident)  - Chest Pain  - Difficulty breathing  - Severe nausea or vomiting  - Inability to tolerate food and liquids  - Passing out  - Skin becoming red around your wounds  - Change in mental status (confusion or lethargy)  - New numbness or weakness     Please return if you have any other emergent concerns.  Additional Information:  Your vital signs today were: BP 123/69 mmHg   Pulse 101   Temp(Src) 98.1 F (36.7 C) (Oral)   Resp 16   Ht 5\' 6"  (1.676 m)   Wt 68.04 kg   BMI 24.22 kg/m2   SpO2 100%   LMP 08/19/2015 If your blood pressure (BP) was elevated above 135/85 this visit, please have this repeated by your doctor within one month. ---------------

## 2015-09-17 NOTE — ED Provider Notes (Signed)
CSN: [id number]     Arrival date & time 09/17/15  1451 History   First MD Initiated Contact with Patient 09/17/15 1508     Chief Complaint  Patient presents with  . Vaginal Discharge   (Consider location/radiation/quality/duration/timing/severity/associated sxs/prior Treatment) HPI 20 y.o. female, presents to the Emergency Department today complaining of vaginal discharge, itching  x 3 days with odor. Does not endorse abdominal pain. No N/V/D. No fevers. No dysuria. Pt without pain currently. Pt is sexually active with one person. No other symptoms noted. LMP July 1st.    Past Medical History  Diagnosis Date  . Sickle cell trait (HCC)    History reviewed. No pertinent past surgical history. History reviewed. No pertinent family history. Social History  Substance Use Topics  . Smoking status: Former Games developer  . Smokeless tobacco: None  . Alcohol Use: No   OB History    Gravida Para Term Preterm AB TAB SAB Ectopic Multiple Living   1              Review of Systems ROS reviewed and all are negative for acute change except as noted in the HPI.  Allergies  Review of patient's allergies indicates no known allergies.  Home Medications   Prior to Admission medications   Not on File   BP 123/69 mmHg  Pulse 101  Temp(Src) 98.1 F (36.7 C) (Oral)  Resp 16  Ht  (1.676 m)  Wt 68.04 kg  BMI 24.22 kg/m2  SpO2 100%  LMP 08/19/2015   Physical Exam  Constitutional: She is oriented to person, place, and time. She appears well-developed and well-nourished.  HENT:  Head: Normocephalic and atraumatic.  Eyes: EOM are normal.  Cardiovascular: Normal rate, regular rhythm, normal heart sounds and intact distal pulses.   Pulmonary/Chest: Effort normal and breath sounds normal. No respiratory distress. She has no wheezes. She has no rales. She exhibits no tenderness.  Abdominal: Soft. There is no tenderness.  Musculoskeletal: Normal range of motion.  Neurological: She is alert and  oriented to person, place, and time.  Skin: Skin is warm and dry.  Psychiatric: She has a normal mood and affect. Her behavior is normal. Thought content normal.  Nursing note and vitals reviewed.  Exam performed by Eston Esters,  exam chaperoned Date: 09/17/2015 Pelvic exam: normal external genitalia without evidence of trauma. VULVA: normal appearing vulva with no masses, tenderness or lesion. VAGINA: normal appearing vagina with normal color and discharge, no lesions. CERVIX: normal appearing cervix without lesions, cervical motion tenderness absent, cervical os closed with out purulent discharge; vaginal discharge - copious and creamy, Wet prep and DNA probe for chlamydia and GC obtained.   ADNEXA: normal adnexa in size, nontender and no masses UTERUS: uterus is normal size, shape, consistency and nontender.   ED Course  Procedures (including critical care time) Labs Review Labs Reviewed  WET PREP, GENITAL - Abnormal; Notable for the following:    Clue Cells Wet Prep HPF POC PRESENT (*)    WBC, Wet Prep HPF POC MANY (*)    All other components within normal limits  URINALYSIS, ROUTINE W REFLEX MICROSCOPIC (NOT AT [facility]) - Abnormal; Notable for the following:    Leukocytes, UA SMALL (*)    All other components within normal limits  URINE MICROSCOPIC-ADD ON - Abnormal; Notable for the following:    Squamous Epithelial / LPF 0-5 (*)    Bacteria, UA MANY (*)    All other components within normal limits  PREGNANCY, URINE  GC/CHLAMYDIA PROBE AMP ([place]) NOT AT [facility]   Imaging Review No results found. I have personally reviewed and evaluated these images and lab results as part of my medical decision-making.   EKG Interpretation None     MDM  I have reviewed and evaluated the relevant laboratory values I have reviewed the relevant previous healthcare records.I obtained HPI from historian.  ED Course:  Assessment: Pt is a 20yF who presents with vaginal discharge x 3 days.  No pelvic pain. Notes odor and itching. On exam, pt in NAD. Nontoxic/nonseptic appearing. VSS. Afebrile. Lungs CTA. Heart RRR. Abdomen nontender soft. GU exam with clear discharge. No CMT or Adnexal tenderness. UA unremarkable. Preg negative. Wet Prep shows BV. Given Flagyl 2g in ED. GC obtained. Low indication for STI. Plan is to DC home with follow up to PCP. At time of discharge, Patient is in no acute distress. Vital Signs are stable. Patient is able to ambulate. Patient able to tolerate PO.   Disposition/Plan:  DC Home Additional Verbal discharge instructions given and discussed with patient.  Pt Instructed to f/u with PCP in the next week for evaluation and treatment of symptoms. Return precautions given Pt acknowledges and agrees with plan  Supervising Physician Rolan BuccoMelanie Belfi, MD   Final diagnoses:  Bacterial vaginosis     Audry Piliyler Nastashia Gallo, PA-C 09/17/15 1636  Rolan BuccoMelanie Belfi, MD 09/17/15 [phone]

## 2015-09-17 NOTE — ED Notes (Signed)
Pt c/o vaginal discharge and itching x 3 days denies urinary symptoms

## 2015-09-20 LAB — GC/CHLAMYDIA PROBE AMP (~~LOC~~) NOT AT ARMC
CHLAMYDIA, DNA PROBE: NEGATIVE
NEISSERIA GONORRHEA: NEGATIVE

## 2015-10-15 ENCOUNTER — Emergency Department (HOSPITAL_BASED_OUTPATIENT_CLINIC_OR_DEPARTMENT_OTHER)
Admission: EM | Admit: 2015-10-15 | Discharge: 2015-10-15 | Disposition: A | Discharge status: Home / Self Care | Payer: Medicaid Other | Attending: Emergency Medicine | Admitting: Emergency Medicine

## 2015-10-15 ENCOUNTER — Encounter (HOSPITAL_BASED_OUTPATIENT_CLINIC_OR_DEPARTMENT_OTHER): Payer: Self-pay | Admitting: *Deleted

## 2015-10-15 DIAGNOSIS — B9689 Other specified bacterial agents as the cause of diseases classified elsewhere: Secondary | ICD-10-CM

## 2015-10-15 DIAGNOSIS — N760 Acute vaginitis: Secondary | ICD-10-CM | POA: Insufficient documentation

## 2015-10-15 DIAGNOSIS — Z87891 Personal history of nicotine dependence: Secondary | ICD-10-CM | POA: Insufficient documentation

## 2015-10-15 LAB — URINALYSIS, ROUTINE W REFLEX MICROSCOPIC
Bilirubin Urine: NEGATIVE
GLUCOSE, UA: NEGATIVE mg/dL
Hgb urine dipstick: NEGATIVE
KETONES UR: NEGATIVE mg/dL
Nitrite: NEGATIVE
PH: 7.5 (ref 5.0–8.0)
Protein, ur: NEGATIVE mg/dL
Specific Gravity, Urine: 1.014 (ref 1.005–1.030)

## 2015-10-15 LAB — WET PREP, GENITAL
SPERM: NONE SEEN
Trich, Wet Prep: NONE SEEN
Yeast Wet Prep HPF POC: NONE SEEN

## 2015-10-15 LAB — URINE MICROSCOPIC-ADD ON: RBC / HPF: NONE SEEN RBC/hpf (ref 0–5)

## 2015-10-15 LAB — PREGNANCY, URINE: Preg Test, Ur: NEGATIVE

## 2015-10-15 MED ORDER — METRONIDAZOLE 500 MG PO TABS: 500.0000 mg | ORAL_TABLET | Freq: Two times a day (BID) | ORAL | 0 refills | Status: AC

## 2015-10-15 MED ORDER — METRONIDAZOLE 500 MG PO TABS
500.0000 mg | ORAL_TABLET | Freq: Once | ORAL | Status: AC
  Administered 2015-10-15: 500 mg via ORAL
  Filled 2015-10-15: qty 1

## 2015-10-15 MED FILL — metroNIDAZOLE 500 MG TABS: 500 | 7 days supply | Qty: 14 | Fill #0

## 2015-10-15 NOTE — Discharge Instructions (Signed)
You were seen in the ED today and found to have bacterial vaginosis. We are treating this with antibiotics. We will call if any other tests result positive.   Return with any fever, chills, abdominal pain, vomiting, or other concerning symptoms.

## 2015-10-15 NOTE — ED Provider Notes (Signed)
Emergency Department Provider Note   I have reviewed the triage vital signs and the nursing notes.   HISTORY  Chief Complaint Vaginal Discharge   HPI Rebecca Delgado is a 20 y.o. female with PMH of SS trait presents to the emergency department for evaluation of right lower back pain, urinary frequency, bad vaginal odor, and vaginal itching. Patient denies any vaginal bleeding or discharge. She states she is not sexually active. She was seen in the emergency department in mid July for similar symptoms was treated for bacterial vaginosis. She reports a history of frequent urinary tract infection symptoms. No fevers, chills, muscle aches. Patient's had no associated vomiting or diarrhea. Her back pain is intermittent and is alleviated with Motrin. She denies any pain radiating down her leg. No groin numbness or other red flag symptoms.    Past Medical History:  Diagnosis Date  . Sickle cell trait (HCC)     There are no active problems to display for this patient.   History reviewed. No pertinent surgical history.    Allergies Review of patient's allergies indicates no known allergies.  No family history on file.  Social History Social History  Substance Use Topics  . Smoking status: Former Games developermoker  . Smokeless tobacco: Not on file  . Alcohol use No    Review of Systems  Constitutional: No fever/chills Eyes: No visual changes. ENT: No sore throat. Cardiovascular: Denies chest pain. Respiratory: Denies shortness of breath. Gastrointestinal: No abdominal pain.  No nausea, no vomiting.  No diarrhea.  No constipation. Genitourinary: Negative for dysuria. Positive urinary frequency, vaginal odor, and vaginal itching.  Musculoskeletal: Positive for right lower back pain. Skin: Negative for rash. Neurological: Negative for headaches, focal weakness or numbness.  10-point ROS otherwise negative.  ____________________________________________   PHYSICAL EXAM:  VITAL  SIGNS: ED Triage Vitals  Enc Vitals Group     BP 10/15/15 1451 105/61     Pulse Rate 10/15/15 1451 81     Resp 10/15/15 1451 16     Temp 10/15/15 1451 98.6 F (37 C)     Temp Source 10/15/15 1451 Oral     SpO2 10/15/15 1451 100 %     Weight 10/15/15 1449 150 lb (68 kg)     Height 10/15/15 1449 5\' 6"  (1.676 m)     Pain Score 10/15/15 1450 8    Constitutional: Alert and oriented. Well appearing and in no acute distress. Eyes: Conjunctivae are normal. PERRL. EOMI. Head: Atraumatic. Nose: No congestion/rhinnorhea. Mouth/Throat: Mucous membranes are moist.  Oropharynx non-erythematous. Neck: No stridor.  Cardiovascular: Normal rate, regular rhythm. Good peripheral circulation. Grossly normal heart sounds.   Respiratory: Normal respiratory effort.  No retractions. Lungs CTAB. Gastrointestinal: Soft and nontender. No distention.  Genitourinary: No CVA tenderness. Moderate vaginal discharge. No CMT. No adnexal tenderness or fullness. No bleeding.  Musculoskeletal: No lower extremity tenderness nor edema. No gross deformities of extremities. Neurologic:  Normal speech and language. No gross focal neurologic deficits are appreciated.  Skin:  Skin is warm, dry and intact. No rash noted. Psychiatric: Mood and affect are normal. Speech and behavior are normal.  ____________________________________________   LABS (all labs ordered are listed, but only abnormal results are displayed)  Labs Reviewed  WET PREP, GENITAL - Abnormal; Notable for the following:       Result Value   Clue Cells Wet Prep HPF POC PRESENT (*)    WBC, Wet Prep HPF POC MODERATE (*)    All other components within  normal limits  URINALYSIS, ROUTINE W REFLEX MICROSCOPIC (NOT AT [facility]) - Abnormal; Notable for the following:    Leukocytes, UA TRACE (*)    All other components within normal limits  URINE MICROSCOPIC-ADD ON - Abnormal; Notable for the following:    Squamous Epithelial / LPF 0-5 (*)    Bacteria, UA FEW (*)     All other components within normal limits  PREGNANCY, URINE  GC/CHLAMYDIA PROBE AMP ([place]) NOT AT [facility]   ____________________________________________  RADIOLOGY  None ____________________________________________   PROCEDURES  Procedure(s) performed:   Procedures  None ____________________________________________   INITIAL IMPRESSION / ASSESSMENT AND PLAN / ED COURSE  Pertinent labs & imaging results that were available during my care of the patient were reviewed by me and considered in my medical decision making (see chart for details).  Patient presents to the emergency department for evaluation of urinary frequency, foul vaginal odor, vaginal itching. She was treated for bacterial vaginosis during emergency department visit several weeks ago. Patient does have right lower back pain but no CVA tenderness. She has no signs or symptoms of systemic infection to suggest pyelonephritis. No dysuria. Pregnancy test negative. Plan for pelvic exam with wet prep, STI screening, and reassessment. No red flag symptoms of the patient's back pain to suggest cauda equina or other spinal emergency. No clinical concern for pyelonephritis. No indication for further imaging.   At this time, I do not feel there is any life-threatening condition present. I have reviewed and discussed all results (EKG, imaging, lab, urine as appropriate), exam findings with patient. I have reviewed nursing notes and appropriate previous records.  I feel the patient is safe to be discharged home without further emergent workup. Discussed usual and customary return precautions. Patient and family (if present) verbalize understanding and are comfortable with this plan.  Patient will follow-up with their primary care provider. If they do not have a primary care provider, information for follow-up has been provided to them. All questions have been answered.  ____________________________________________  FINAL  CLINICAL IMPRESSION(S) / ED DIAGNOSES  Final diagnoses:  BV (bacterial vaginosis)     MEDICATIONS GIVEN DURING THIS VISIT:  Medications  metroNIDAZOLE (FLAGYL) tablet 500 mg (500 mg Oral Given 10/15/15 1642)     NEW OUTPATIENT MEDICATIONS STARTED DURING THIS VISIT:  Discharge Medication List as of 10/15/2015  4:29 PM    START taking these medications   Details  metroNIDAZOLE (FLAGYL) 500 MG tablet Take 1 tablet (500 mg total) by mouth 2 (two) times daily., Starting Fri 10/15/2015, Until Fri 10/22/2015, Print        Note:  This document was prepared using Dragon voice recognition software and may include unintentional dictation errors.  Alona BeneJoshua Anela Bensman, MD Emergency Medicine   Maia PlanJoshua G Dashanti Burr, MD 10/16/15 0800

## 2015-10-15 NOTE — ED Triage Notes (Signed)
Pt c/o vaginal discharge with odor and freq urination x 4 days

## 2015-10-18 LAB — GC/CHLAMYDIA PROBE AMP (~~LOC~~) NOT AT ARMC
CHLAMYDIA, DNA PROBE: NEGATIVE
Neisseria Gonorrhea: NEGATIVE

## 2016-01-07 ENCOUNTER — Encounter (HOSPITAL_BASED_OUTPATIENT_CLINIC_OR_DEPARTMENT_OTHER): Payer: Self-pay | Admitting: *Deleted

## 2016-01-07 ENCOUNTER — Emergency Department (HOSPITAL_BASED_OUTPATIENT_CLINIC_OR_DEPARTMENT_OTHER)
Admission: EM | Admit: 2016-01-07 | Discharge: 2016-01-08 | Disposition: A | Discharge status: Home / Self Care | Payer: Medicaid Other | Attending: Emergency Medicine | Admitting: Emergency Medicine

## 2016-01-07 DIAGNOSIS — Z87891 Personal history of nicotine dependence: Secondary | ICD-10-CM | POA: Insufficient documentation

## 2016-01-07 DIAGNOSIS — N760 Acute vaginitis: Secondary | ICD-10-CM | POA: Insufficient documentation

## 2016-01-07 DIAGNOSIS — B9689 Other specified bacterial agents as the cause of diseases classified elsewhere: Secondary | ICD-10-CM

## 2016-01-07 LAB — WET PREP, GENITAL
SPERM: NONE SEEN
Trich, Wet Prep: NONE SEEN
Yeast Wet Prep HPF POC: NONE SEEN

## 2016-01-07 LAB — URINALYSIS, ROUTINE W REFLEX MICROSCOPIC
BILIRUBIN URINE: NEGATIVE
Glucose, UA: NEGATIVE mg/dL
HGB URINE DIPSTICK: NEGATIVE
Ketones, ur: NEGATIVE mg/dL
Nitrite: NEGATIVE
PH: 6.5 (ref 5.0–8.0)
Protein, ur: NEGATIVE mg/dL
SPECIFIC GRAVITY, URINE: 1.013 (ref 1.005–1.030)

## 2016-01-07 LAB — URINE MICROSCOPIC-ADD ON

## 2016-01-07 LAB — PREGNANCY, URINE: Preg Test, Ur: NEGATIVE

## 2016-01-07 NOTE — ED Triage Notes (Signed)
Pt c/o vaginal itching x 3 days denies discharge

## 2016-01-07 NOTE — ED Notes (Signed)
C/o vaginal itching x 3 days  Some white dc,  Denies urinary sx  No pain

## 2016-01-07 NOTE — ED Provider Notes (Signed)
[facility] Note   CSN: [id number] Arrival date & time: 01/07/16  2333 By signing my name below, I, Rebecca Delgado, attest that this documentation has been prepared under the direction and in the presence of [name], MD . Electronically Signed: Levon HedgerElizabeth Delgado, Scribe. 01/07/2016. 11:50 PM.   History   Chief Complaint Chief Complaint  Patient presents with  . Vaginal Itching   HPI Rebecca HarderChristian Delgado is a 20 y.o. female who presents to the Emergency Department complaining of vaginal itching which began three days ago. She notes associated discharge and odor. No alleviating or modifying factors noted.  Pt is not followed by an OBGYN. She denies vaginal bleeding, fever, diarrhea, rashes, or lesions.   The history is provided by the patient. No language interpreter was used.  Vaginal Itching  This is a recurrent problem. The current episode started more than 2 days ago. The problem occurs constantly. The problem has not changed since onset.Pertinent negatives include no chest pain and no abdominal pain. Nothing aggravates the symptoms. Nothing relieves the symptoms. The treatment provided no relief.   Past Medical History:  Diagnosis Date  . Sickle cell trait (HCC)     There are no active problems to display for this patient.   No past surgical history on file.  OB History    Gravida Para Term Preterm AB Living   1             SAB TAB Ectopic Multiple Live Births                  Home Medications    Prior to Admission medications   Not on File   Family History No family history on file.  Social History Social History  Substance Use Topics  . Smoking status: Former Games developermoker  . Smokeless tobacco: Not on file  . Alcohol use No    Allergies   Patient has no known allergies.  Review of Systems Review of Systems  Constitutional: Negative for fever.  Cardiovascular: Negative for chest pain.  Gastrointestinal: Negative for abdominal pain and  diarrhea.  Genitourinary: Positive for vaginal discharge. Negative for vaginal bleeding.  Skin: Negative for rash and wound.  All other systems reviewed and are negative.  Physical Exam Updated Vital Signs There were no vitals taken for this visit.  Physical Exam  Constitutional: She is oriented to person, place, and time. She appears well-developed and well-nourished. No distress.  HENT:  Head: Normocephalic and atraumatic.  Mouth/Throat: Uvula is midline and oropharynx is clear and moist. No oropharyngeal exudate.  Moist mucous membranes   Eyes: Conjunctivae are normal. Pupils are equal, round, and reactive to light.  Neck: No JVD present.  Trachea midline No bruit  Cardiovascular: Normal rate, regular rhythm, normal heart sounds and intact distal pulses.   Pulmonary/Chest: Effort normal and breath sounds normal. No stridor. No respiratory distress. She has no wheezes. She has no rales.  Abdominal: Soft. Bowel sounds are normal. She exhibits no distension. There is no rebound and no guarding.  Genitourinary: Cervix exhibits no motion tenderness. Right adnexum displays no tenderness. Left adnexum displays no tenderness.  Genitourinary Comments: Scant white discharge. Chaperone was present for exam which was performed with no discomfort or complications.    Neurological: She is alert and oriented to person, place, and time. She has normal reflexes.  Skin: Skin is warm and dry. No rash noted.  Psychiatric: She has a normal mood and affect. Her behavior is normal.  Nursing note and vitals reviewed.  ED Treatments / Results  DIAGNOSTIC STUDIES:  Oxygen Saturation is 100% on RA, normal by my interpretation.    COORDINATION OF CARE:  11:48 PM Discussed treatment plan with pt at bedside and pt agreed to plan.  Results for orders placed or performed during the hospital encounter of 01/07/16  Wet prep, genital  Result Value Ref Range   Yeast Wet Prep HPF POC NONE SEEN NONE SEEN    Trich, Wet Prep NONE SEEN NONE SEEN   Clue Cells Wet Prep HPF POC PRESENT (A) NONE SEEN   WBC, Wet Prep HPF POC MANY (A) NONE SEEN   Sperm NONE SEEN   Pregnancy, urine  Result Value Ref Range   Preg Test, Ur NEGATIVE NEGATIVE  Urinalysis, Routine w reflex microscopic (not at [facility])  Result Value Ref Range   Color, Urine YELLOW YELLOW   APPearance CLOUDY (A) CLEAR   Specific Gravity, Urine 1.013 1.005 - 1.030   pH 6.5 5.0 - 8.0   Glucose, UA NEGATIVE NEGATIVE mg/dL   Hgb urine dipstick NEGATIVE NEGATIVE   Bilirubin Urine NEGATIVE NEGATIVE   Ketones, ur NEGATIVE NEGATIVE mg/dL   Protein, ur NEGATIVE NEGATIVE mg/dL   Nitrite NEGATIVE NEGATIVE   Leukocytes, UA MODERATE (A) NEGATIVE  Urine microscopic-add on  Result Value Ref Range   Squamous Epithelial / LPF 6-30 (A) NONE SEEN   WBC, UA 6-30 0 - 5 WBC/hpf   RBC / HPF 0-5 0 - 5 RBC/hpf   Bacteria, UA MANY (A) NONE SEEN   No results found.   Procedures Procedures (including critical care time)    Final Clinical Impressions(s) / ED Diagnoses  She is safe for discharge.All questions answered to patient's satisfaction. Based on history and exam patient has been appropriately medically screened and emergency conditions excluded. Patient is stable for discharge at this time. Follow up with your PMD for recheck in 2 days and strict return precautions given.   I personally performed the services described in this documentation, which was scribed in my presence. The recorded information has been reviewed and is accurate.      Cy BlamerApril Kazumi Lachney, MD 01/08/16 [phone]

## 2016-01-08 MED ORDER — METRONIDAZOLE 500 MG PO TABS
500.0000 mg | ORAL_TABLET | Freq: Once | ORAL | Status: AC
  Administered 2016-01-08: 500 mg via ORAL
  Filled 2016-01-08: qty 1

## 2016-01-08 MED ORDER — METRONIDAZOLE 500 MG PO TABS: 500.0000 mg | ORAL_TABLET | Freq: Two times a day (BID) | ORAL | 0 refills | Status: DC

## 2016-01-10 LAB — GC/CHLAMYDIA PROBE AMP (~~LOC~~) NOT AT ARMC
Chlamydia: NEGATIVE
NEISSERIA GONORRHEA: NEGATIVE

## 2017-04-14 ENCOUNTER — Emergency Department (HOSPITAL_COMMUNITY): Admission: EM | Admit: 2017-04-14 | Discharge: 2017-04-15 | Discharge status: Against Medical Advice | Payer: Self-pay

## 2017-04-14 NOTE — ED Notes (Signed)
Called for Pt x3

## 2017-04-16 ENCOUNTER — Other Ambulatory Visit: Payer: Self-pay

## 2017-04-16 ENCOUNTER — Emergency Department (HOSPITAL_BASED_OUTPATIENT_CLINIC_OR_DEPARTMENT_OTHER)
Admission: EM | Admit: 2017-04-16 | Discharge: 2017-04-16 | Disposition: A | Discharge status: Home / Self Care | Payer: Self-pay | Attending: Physician Assistant | Admitting: Physician Assistant

## 2017-04-16 ENCOUNTER — Encounter (HOSPITAL_BASED_OUTPATIENT_CLINIC_OR_DEPARTMENT_OTHER): Payer: Self-pay | Admitting: *Deleted

## 2017-04-16 DIAGNOSIS — N760 Acute vaginitis: Secondary | ICD-10-CM | POA: Insufficient documentation

## 2017-04-16 DIAGNOSIS — B9689 Other specified bacterial agents as the cause of diseases classified elsewhere: Secondary | ICD-10-CM | POA: Insufficient documentation

## 2017-04-16 DIAGNOSIS — M549 Dorsalgia, unspecified: Secondary | ICD-10-CM | POA: Insufficient documentation

## 2017-04-16 DIAGNOSIS — Z87891 Personal history of nicotine dependence: Secondary | ICD-10-CM | POA: Insufficient documentation

## 2017-04-16 LAB — URINALYSIS, MICROSCOPIC (REFLEX)

## 2017-04-16 LAB — URINALYSIS, ROUTINE W REFLEX MICROSCOPIC
BILIRUBIN URINE: NEGATIVE
GLUCOSE, UA: NEGATIVE mg/dL
KETONES UR: NEGATIVE mg/dL
Nitrite: NEGATIVE
PROTEIN: NEGATIVE mg/dL
Specific Gravity, Urine: 1.02 (ref 1.005–1.030)
pH: 6.5 (ref 5.0–8.0)

## 2017-04-16 LAB — WET PREP, GENITAL
Sperm: NONE SEEN
Trich, Wet Prep: NONE SEEN
Yeast Wet Prep HPF POC: NONE SEEN

## 2017-04-16 LAB — PREGNANCY, URINE: PREG TEST UR: NEGATIVE

## 2017-04-16 MED ORDER — METRONIDAZOLE 500 MG PO TABS: 500.0000 mg | ORAL_TABLET | Freq: Two times a day (BID) | ORAL | 0 refills | Status: DC

## 2017-04-16 MED ORDER — METRONIDAZOLE 500 MG PO TABS
500.0000 mg | ORAL_TABLET | Freq: Once | ORAL | Status: AC
  Administered 2017-04-16: 500 mg via ORAL
  Filled 2017-04-16: qty 1

## 2017-04-16 MED FILL — metroNIDAZOLE 500 MG TABS: 500 | 7 days supply | Qty: 14 | Fill #0

## 2017-04-16 NOTE — ED Provider Notes (Signed)
[facility] EMERGENCY DEPARTMENT Provider Note   CSN: [id number] Arrival date & time: 04/16/17  4098     History   Chief Complaint Chief Complaint  Patient presents with  . Back Pain    HPI Laressa Bolinger is a 22 y.o. female.  HPI   Patient is a 22 year old female presenting with bilateral back pain and vaginal discharge.  Patient reports a white chunky discharge.  Not malodorous.  Some lower cramping and some itching in her vaginal area.  No dysuria.  Endorses sexual activity.  Last menstrual period was 3 weeks ago.  No nausea vomiting diarrhea.  Past Medical History:  Diagnosis Date  . Sickle cell trait (HCC)     There are no active problems to display for this patient.   History reviewed. No pertinent surgical history.  OB History    Gravida Para Term Preterm AB Living   1             SAB TAB Ectopic Multiple Live Births                   Home Medications    Prior to Admission medications   Medication Sig Start Date End Date Taking? Authorizing Provider  metroNIDAZOLE (FLAGYL) 500 MG tablet Take 1 tablet (500 mg total) by mouth 2 (two) times daily. One po bid x 7 days 01/08/16   Cy Blamer, MD    Family History History reviewed. No pertinent family history.  Social History Social History   Tobacco Use  . Smoking status: Former Games developer  . Smokeless tobacco: Never Used  Substance Use Topics  . Alcohol use: No  . Drug use: No     Allergies   Patient has no known allergies.   Review of Systems Review of Systems  Constitutional: Negative for activity change.  Respiratory: Negative for shortness of breath.   Cardiovascular: Negative for chest pain.  Gastrointestinal: Negative for abdominal pain.  Genitourinary: Positive for vaginal discharge. Negative for difficulty urinating, dyspareunia, dysuria and flank pain.  All other systems reviewed and are negative.    Physical Exam Updated Vital Signs BP 114/75 (BP Location:  Left Arm)   Pulse 83   Temp 99.2 F (37.3 C) (Oral)   Resp 18   Ht 5\' 6"  (1.676 m)   Wt 80.6 kg (177 lb 9.6 oz)   LMP 03/26/2017 (Exact Date)   SpO2 100%   BMI 28.67 kg/m   Physical Exam  Constitutional: She is oriented to person, place, and time. She appears well-developed and well-nourished.  HENT:  Head: Normocephalic and atraumatic.  Eyes: Right eye exhibits no discharge. Left eye exhibits no discharge.  Cardiovascular: Normal rate, regular rhythm and normal heart sounds.  No murmur heard. Pulmonary/Chest: Effort normal and breath sounds normal. She has no wheezes. She has no rales.  Abdominal: Soft. She exhibits no distension. There is no tenderness.  Genitourinary: Vagina normal.  Genitourinary Comments: White discharge  Neurological: She is oriented to person, place, and time.  Skin: Skin is warm and dry. She is not diaphoretic.  Psychiatric: She has a normal mood and affect.  Nursing note and vitals reviewed.    ED Treatments / Results  Labs (all labs ordered are listed, but only abnormal results are displayed) Labs Reviewed  WET PREP, GENITAL  URINALYSIS, ROUTINE W REFLEX MICROSCOPIC  PREGNANCY, URINE  GC/CHLAMYDIA PROBE AMP ([place]) NOT AT [facility]    EKG  EKG Interpretation None  Radiology No results found.  Procedures Procedures (including critical care time)  Medications Ordered in ED Medications - No data to display   Initial Impression / Assessment and Plan / ED Course  I have reviewed the triage vital signs and the nursing notes.  Pertinent labs & imaging results that were available during my care of the patient were reviewed by me and considered in my medical decision making (see chart for details).       Patient is a 22 year old female presenting with bilateral back pain and vaginal discharge.  Patient reports a white chunky discharge.  Not malodorous.  Some lower cramping and some itching in her vaginal area.  No  dysuria.  Endorses sexual activity.  Last menstrual period was 3 weeks ago.  No nausea vomiting diarrhea.     Final Clinical Impressions(s) / ED Diagnoses   Final diagnoses:  None    ED Discharge Orders    None       Abelino DerrickMackuen, Ioanna Colquhoun Lyn, MD 04/17/17 [phone]

## 2017-04-16 NOTE — ED Notes (Signed)
NAD at this time. Pt is stable and going home.  

## 2017-04-16 NOTE — ED Triage Notes (Signed)
Pt has been having lower back cramping and itching for one week.

## 2017-04-17 LAB — GC/CHLAMYDIA PROBE AMP (~~LOC~~) NOT AT ARMC
Chlamydia: POSITIVE — AB
Neisseria Gonorrhea: NEGATIVE

## 2017-04-18 ENCOUNTER — Telehealth: Payer: Self-pay | Admitting: Medical

## 2017-04-18 DIAGNOSIS — A749 Chlamydial infection, unspecified: Secondary | ICD-10-CM

## 2017-04-18 MED ORDER — AZITHROMYCIN 250 MG PO TABS
1000.0000 mg | ORAL_TABLET | Freq: Once | ORAL | 0 refills | Status: AC
Start: 2017-04-18 — End: 2017-04-18

## 2017-04-18 NOTE — Telephone Encounter (Addendum)
Rebecca Delgado tested positive for  Chlamydia. Patient was called by RN and allergies and pharmacy confirmed. Rx sent to pharmacy of choice.   Rebecca Delgado, Rebecca Samson N, PA-C 04/18/2017 3:51 PM      ----- Message from Kathe BectonLori S Berdik, RN sent at 04/18/2017 11:55 AM EST ----- This patient tested positive for ::"Chlamydia",  She ::"has NKDA", I have informed the patient of her results and confirmed her pharmacy is correct in her chart. Please send Rx.   Thank you,   Kathe BectonBerdik, Lori S, RN   [facility]

## 2017-05-24 IMAGING — DX DG CHEST 2V
2 series · 2 of 2 positions shown · non-contrast
Comparison: None.

CLINICAL DATA: Cough. Former smoker with history of sickle cell
trait. Cough with onset over 2 weeks ago. Emesis and fever.

EXAM:
CHEST  2 VIEW

[chest pa]
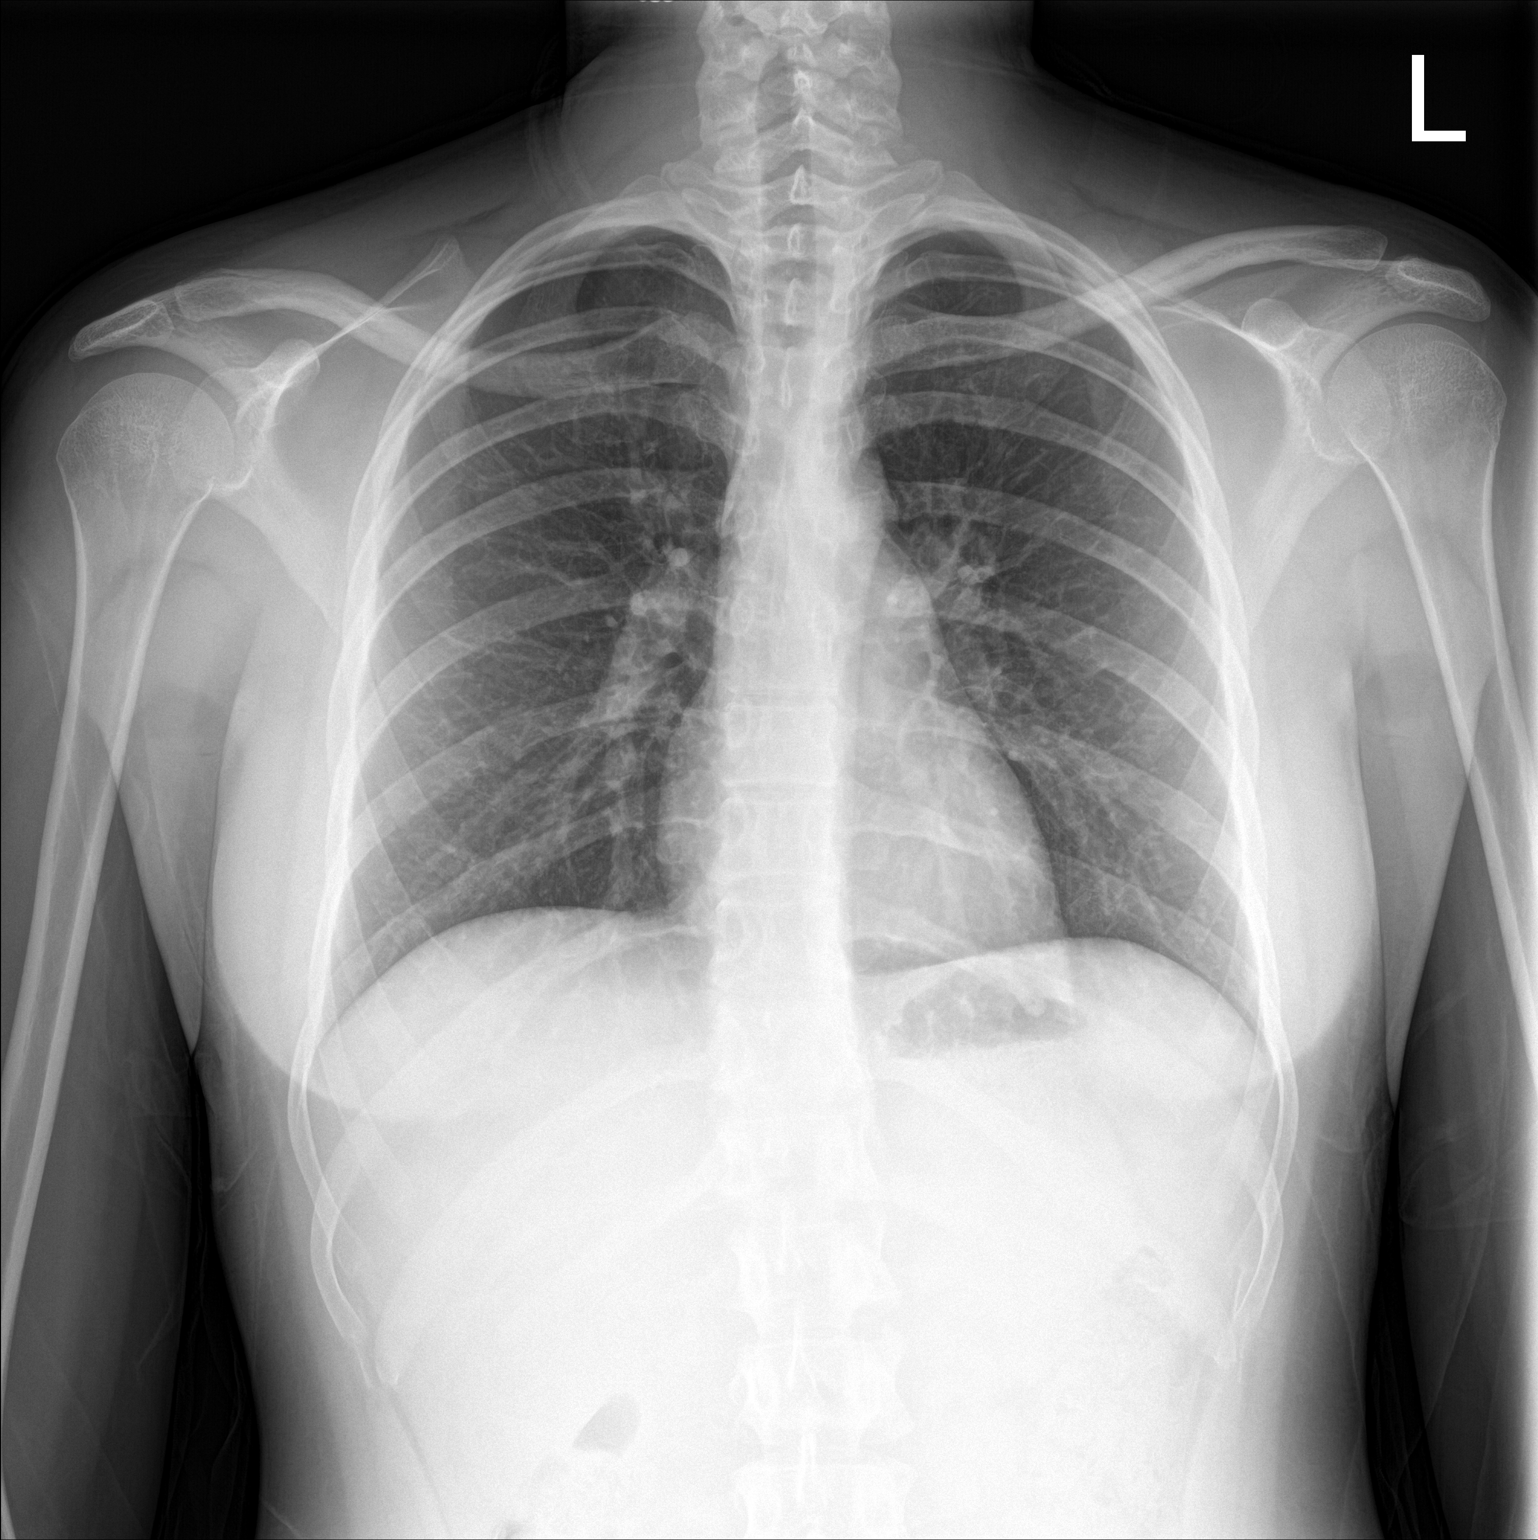

[chest lat]
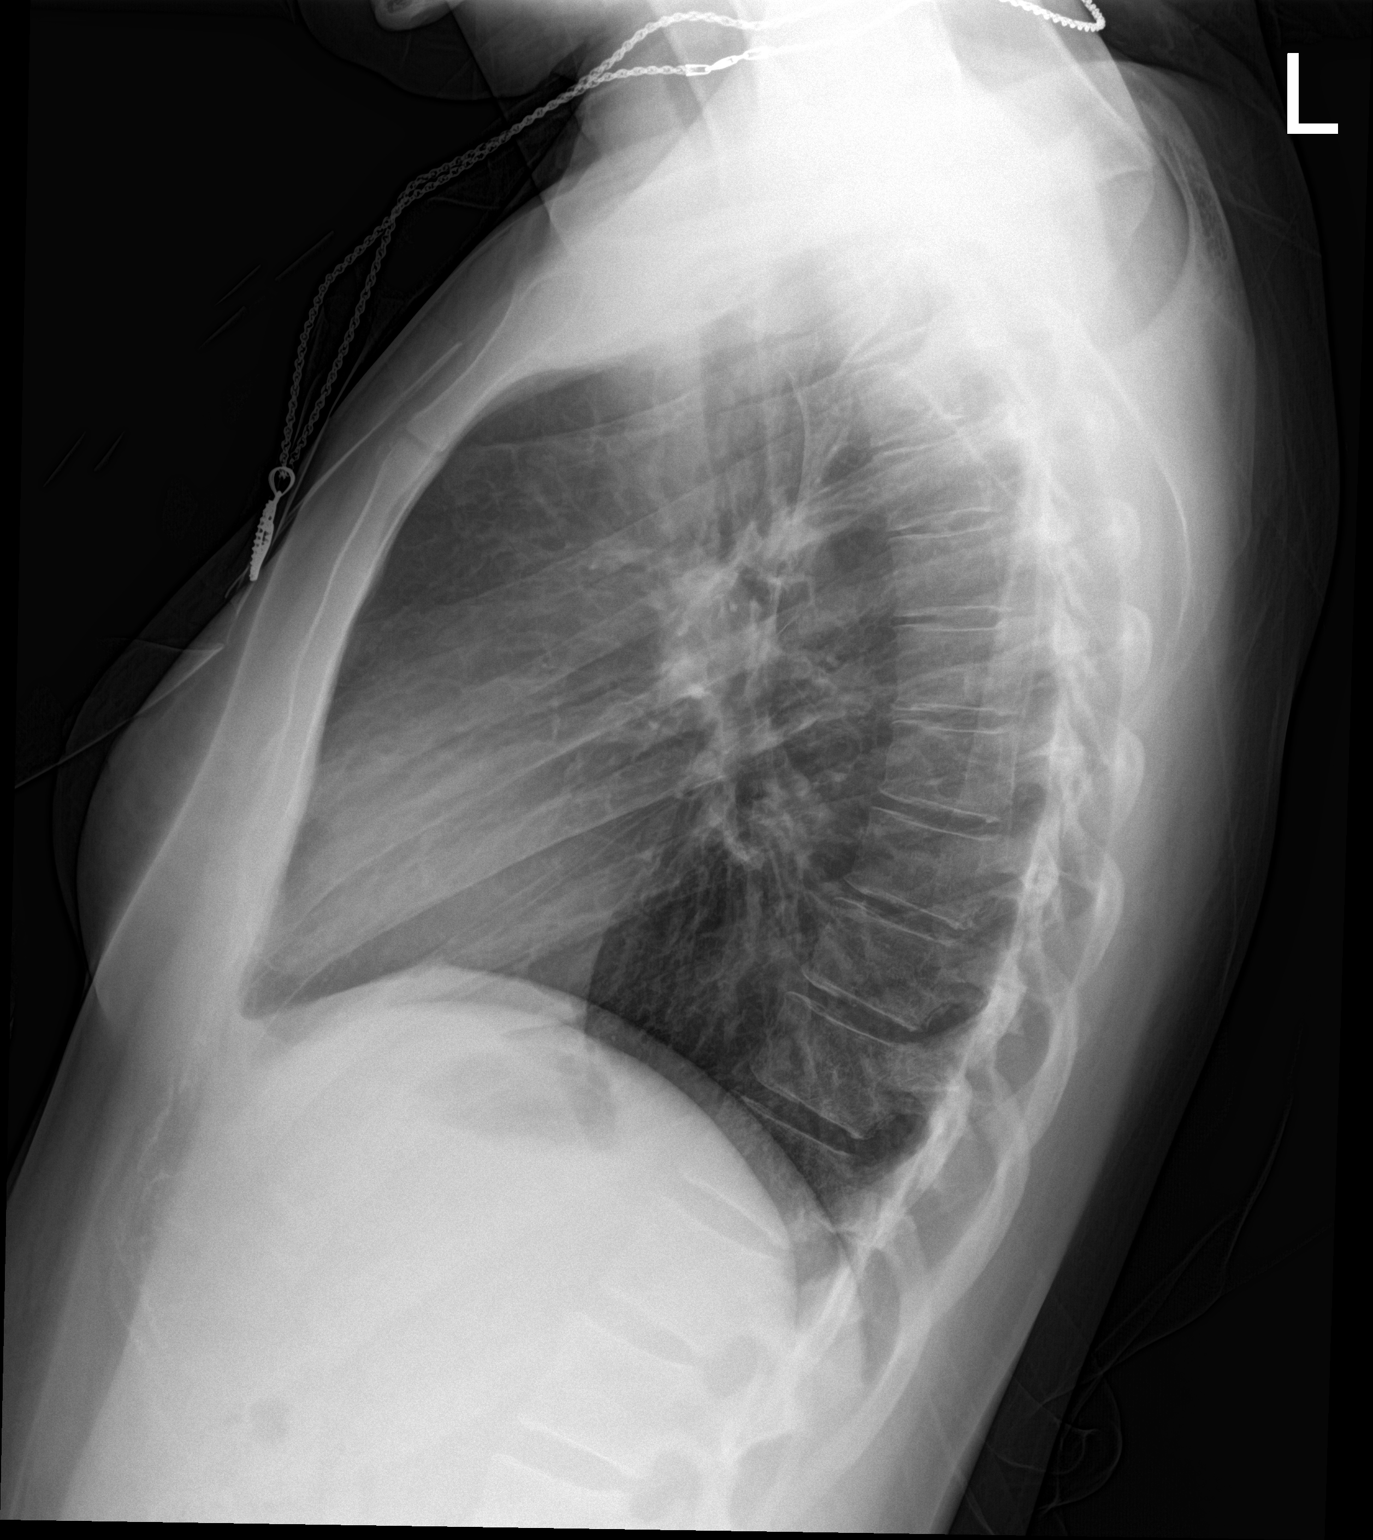

[2 of 2 positions shown; findings below may reference images not displayed]

FINDINGS: The heart size and mediastinal contours are within normal limits.
Both lungs are clear. The visualized skeletal structures are
unremarkable.
IMPRESSION: No active cardiopulmonary disease.

## 2017-07-01 ENCOUNTER — Emergency Department (HOSPITAL_BASED_OUTPATIENT_CLINIC_OR_DEPARTMENT_OTHER): Admission: EM | Admit: 2017-07-01 | Discharge: 2017-07-01 | Discharge status: Against Medical Advice | Payer: Self-pay

## 2017-07-01 NOTE — ED Notes (Signed)
Called second time, no answer.

## 2017-07-01 NOTE — ED Triage Notes (Signed)
3rd call for triage no answer.

## 2017-07-01 NOTE — ED Triage Notes (Signed)
Called for triage no answer  

## 2017-10-17 ENCOUNTER — Encounter (HOSPITAL_BASED_OUTPATIENT_CLINIC_OR_DEPARTMENT_OTHER): Payer: Self-pay | Admitting: Emergency Medicine

## 2017-10-17 ENCOUNTER — Other Ambulatory Visit: Payer: Self-pay

## 2017-10-17 ENCOUNTER — Emergency Department (HOSPITAL_BASED_OUTPATIENT_CLINIC_OR_DEPARTMENT_OTHER)
Admission: EM | Admit: 2017-10-17 | Discharge: 2017-10-17 | Disposition: A | Discharge status: Home / Self Care | Payer: Self-pay | Attending: Emergency Medicine | Admitting: Emergency Medicine

## 2017-10-17 DIAGNOSIS — N760 Acute vaginitis: Secondary | ICD-10-CM | POA: Insufficient documentation

## 2017-10-17 DIAGNOSIS — A749 Chlamydial infection, unspecified: Secondary | ICD-10-CM | POA: Insufficient documentation

## 2017-10-17 DIAGNOSIS — Z87891 Personal history of nicotine dependence: Secondary | ICD-10-CM | POA: Insufficient documentation

## 2017-10-17 DIAGNOSIS — B9689 Other specified bacterial agents as the cause of diseases classified elsewhere: Secondary | ICD-10-CM | POA: Insufficient documentation

## 2017-10-17 DIAGNOSIS — Z202 Contact with and (suspected) exposure to infections with a predominantly sexual mode of transmission: Secondary | ICD-10-CM

## 2017-10-17 LAB — URINALYSIS, ROUTINE W REFLEX MICROSCOPIC
Bilirubin Urine: NEGATIVE
GLUCOSE, UA: NEGATIVE mg/dL
HGB URINE DIPSTICK: NEGATIVE
Ketones, ur: NEGATIVE mg/dL
Nitrite: NEGATIVE
PH: 6.5 (ref 5.0–8.0)
PROTEIN: NEGATIVE mg/dL
SPECIFIC GRAVITY, URINE: 1.01 (ref 1.005–1.030)

## 2017-10-17 LAB — WET PREP, GENITAL
Sperm: NONE SEEN
TRICH WET PREP: NONE SEEN
YEAST WET PREP: NONE SEEN

## 2017-10-17 LAB — URINALYSIS, MICROSCOPIC (REFLEX): RBC / HPF: NONE SEEN RBC/hpf (ref 0–5)

## 2017-10-17 LAB — PREGNANCY, URINE: PREG TEST UR: NEGATIVE

## 2017-10-17 MED ORDER — CEFTRIAXONE SODIUM 250 MG IJ SOLR
250.0000 mg | Freq: Once | INTRAMUSCULAR | Status: AC
  Administered 2017-10-17: 250 mg via INTRAMUSCULAR
  Filled 2017-10-17: qty 250

## 2017-10-17 MED ORDER — METRONIDAZOLE 500 MG PO TABS
500.0000 mg | ORAL_TABLET | Freq: Once | ORAL | Status: AC
  Administered 2017-10-17: 500 mg via ORAL
  Filled 2017-10-17: qty 1

## 2017-10-17 MED ORDER — AZITHROMYCIN 1 G PO PACK
1.0000 g | PACK | Freq: Once | ORAL | Status: AC
  Administered 2017-10-17: 1 g via ORAL
  Filled 2017-10-17: qty 1

## 2017-10-17 MED ORDER — METRONIDAZOLE 500 MG PO TABS: 500.0000 mg | ORAL_TABLET | Freq: Two times a day (BID) | ORAL | 0 refills | Status: DC
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
## 2017-10-18 LAB — GC/CHLAMYDIA PROBE AMP (~~LOC~~) NOT AT ARMC
Chlamydia: POSITIVE — AB
Neisseria Gonorrhea: NEGATIVE

## 2017-10-19 LAB — RPR: RPR Ser Ql: NONREACTIVE

## 2017-10-19 LAB — HIV ANTIBODY (ROUTINE TESTING W REFLEX): HIV SCREEN 4TH GENERATION: NONREACTIVE

## 2018-01-30 ENCOUNTER — Other Ambulatory Visit: Payer: Self-pay

## 2018-01-30 ENCOUNTER — Emergency Department (HOSPITAL_BASED_OUTPATIENT_CLINIC_OR_DEPARTMENT_OTHER)
Admission: EM | Admit: 2018-01-30 | Discharge: 2018-01-30 | Disposition: A | Discharge status: Home / Self Care | Payer: Self-pay | Attending: Emergency Medicine | Admitting: Emergency Medicine

## 2018-01-30 ENCOUNTER — Encounter (HOSPITAL_BASED_OUTPATIENT_CLINIC_OR_DEPARTMENT_OTHER): Payer: Self-pay

## 2018-01-30 DIAGNOSIS — Z87891 Personal history of nicotine dependence: Secondary | ICD-10-CM | POA: Insufficient documentation

## 2018-01-30 DIAGNOSIS — D573 Sickle-cell trait: Secondary | ICD-10-CM | POA: Insufficient documentation

## 2018-01-30 DIAGNOSIS — R112 Nausea with vomiting, unspecified: Secondary | ICD-10-CM | POA: Insufficient documentation

## 2018-01-30 LAB — URINALYSIS, ROUTINE W REFLEX MICROSCOPIC
Bilirubin Urine: NEGATIVE
GLUCOSE, UA: NEGATIVE mg/dL
HGB URINE DIPSTICK: NEGATIVE
KETONES UR: NEGATIVE mg/dL
Nitrite: NEGATIVE
PH: 6 (ref 5.0–8.0)
PROTEIN: NEGATIVE mg/dL
Specific Gravity, Urine: 1.02 (ref 1.005–1.030)

## 2018-01-30 LAB — URINALYSIS, MICROSCOPIC (REFLEX)

## 2018-01-30 LAB — PREGNANCY, URINE: PREG TEST UR: NEGATIVE

## 2018-01-30 MED ORDER — ONDANSETRON 4 MG PO TBDP: 4.0000 mg | ORAL_TABLET | Freq: Three times a day (TID) | ORAL | 0 refills | Status: DC | PRN

## 2018-01-30 NOTE — Discharge Instructions (Signed)

## 2018-01-30 NOTE — ED Provider Notes (Signed)
Emergency Department Provider Note   I have reviewed the triage vital signs and the nursing notes.   HISTORY  Chief Complaint Emesis   HPI Rebecca Delgado is a 22 y.o. female with PMH of sickle cell trait presents to the ED with nausea and vomiting. No diarrhea. No abdominal pain. No fever, chills, or body aches. Patient period is 1-2 weeks late which is unusual for her. No CP or SOB. No radiation of symptoms or modifying factors. She is keeping liquids down. No blood in the emesis.    Past Medical History:  Diagnosis Date  . Sickle cell trait (HCC)     There are no active problems to display for this patient.   History reviewed. No pertinent surgical history.  Allergies Patient has no known allergies.  No family history on file.  Social History Social History   Tobacco Use  . Smoking status: Former Games developer  . Smokeless tobacco: Never Used  Substance Use Topics  . Alcohol use: No  . Drug use: No    Review of Systems  Constitutional: No fever/chills Eyes: No visual changes. ENT: No sore throat. Cardiovascular: Denies chest pain. Respiratory: Denies shortness of breath. Gastrointestinal: No abdominal pain. Positive nausea and vomiting.  No diarrhea.  No constipation. Genitourinary: Negative for dysuria. Musculoskeletal: Negative for back pain. Skin: Negative for rash. Neurological: Negative for headaches, focal weakness or numbness.  10-point ROS otherwise negative.  ____________________________________________   PHYSICAL EXAM:  VITAL SIGNS: ED Triage Vitals  Enc Vitals Group     BP 01/30/18 1814 121/85     Pulse Rate 01/30/18 1814 87     Resp 01/30/18 1814 18     Temp 01/30/18 1814 98.5 F (36.9 C)     Temp Source 01/30/18 1814 Oral     SpO2 01/30/18 1814 100 %     Weight 01/30/18 1814 176 lb (79.8 kg)     Height 01/30/18 1814 5\' 6"  (1.676 m)     Pain Score 01/30/18 1813 0   Constitutional: Alert and oriented. Well appearing and in no  acute distress. Eyes: Conjunctivae are normal. Head: Atraumatic. Nose: No congestion/rhinnorhea. Mouth/Throat: Mucous membranes are moist.   Neck: No stridor.  Cardiovascular: Normal rate, regular rhythm. Good peripheral circulation. Grossly normal heart sounds.   Respiratory: Normal respiratory effort.  No retractions. Lungs CTAB. Gastrointestinal: Soft and nontender. No distention.  Musculoskeletal: No lower extremity tenderness nor edema. No gross deformities of extremities. Neurologic:  Normal speech and language. No gross focal neurologic deficits are appreciated.  Skin:  Skin is warm, dry and intact. No rash noted.   ____________________________________________   LABS (all labs ordered are listed, but only abnormal results are displayed)  Labs Reviewed  URINALYSIS, ROUTINE W REFLEX MICROSCOPIC - Abnormal; Notable for the following components:      Result Value   APPearance HAZY (*)    Leukocytes, UA SMALL (*)    All other components within normal limits  URINALYSIS, MICROSCOPIC (REFLEX) - Abnormal; Notable for the following components:   Bacteria, UA MANY (*)    All other components within normal limits  PREGNANCY, URINE   ____________________________________________   PROCEDURES  Procedure(s) performed:   Procedures  None ____________________________________________   INITIAL IMPRESSION / ASSESSMENT AND PLAN / ED COURSE  Pertinent labs & imaging results that were available during my care of the patient were reviewed by me and considered in my medical decision making (see chart for details).  Patient presents to the ED with nausea  and vomiting. No abdominal pain or tenderness. Late period according to patient but pregnancy test is negative. Advised repeat testing in 1-2 weeks at home. Provided Zofran. No imaging at this time given well-appearance and benign exam. Discussed ED return precautions and PCP follow up plan.    ____________________________________________  FINAL CLINICAL IMPRESSION(S) / ED DIAGNOSES  Final diagnoses:  Non-intractable vomiting with nausea, unspecified vomiting type     NEW OUTPATIENT MEDICATIONS STARTED DURING THIS VISIT:  Discharge Medication List as of 01/30/2018  6:50 PM    START taking these medications   Details  ondansetron (ZOFRAN ODT) 4 MG disintegrating tablet Take 1 tablet (4 mg total) by mouth every 8 (eight) hours as needed., Starting Wed 01/30/2018, Print        Note:  This document was prepared using Dragon voice recognition software and may include unintentional dictation errors.  Alona BeneJoshua Long, MD Emergency Medicine    Long, Arlyss RepressJoshua G, MD 01/31/18 [phone]

## 2018-01-30 NOTE — ED Triage Notes (Addendum)
C/o n/v x 2 weeks neg home preg test-denies pain-NAD-steady gait

## 2018-01-30 NOTE — ED Notes (Signed)
ED Provider at bedside. 

## 2018-03-03 ENCOUNTER — Other Ambulatory Visit: Payer: Self-pay

## 2018-03-03 ENCOUNTER — Encounter (HOSPITAL_BASED_OUTPATIENT_CLINIC_OR_DEPARTMENT_OTHER): Payer: Self-pay | Admitting: *Deleted

## 2018-03-03 ENCOUNTER — Emergency Department (HOSPITAL_BASED_OUTPATIENT_CLINIC_OR_DEPARTMENT_OTHER)
Admission: EM | Admit: 2018-03-03 | Discharge: 2018-03-03 | Disposition: A | Discharge status: Home / Self Care | Payer: 59 | Attending: Emergency Medicine | Admitting: Emergency Medicine

## 2018-03-03 DIAGNOSIS — Z202 Contact with and (suspected) exposure to infections with a predominantly sexual mode of transmission: Secondary | ICD-10-CM

## 2018-03-03 DIAGNOSIS — R350 Frequency of micturition: Secondary | ICD-10-CM | POA: Insufficient documentation

## 2018-03-03 DIAGNOSIS — Z87891 Personal history of nicotine dependence: Secondary | ICD-10-CM | POA: Diagnosis not present

## 2018-03-03 LAB — URINALYSIS, MICROSCOPIC (REFLEX)

## 2018-03-03 LAB — URINALYSIS, ROUTINE W REFLEX MICROSCOPIC
Bilirubin Urine: NEGATIVE
Glucose, UA: NEGATIVE mg/dL
KETONES UR: NEGATIVE mg/dL
Nitrite: NEGATIVE
PROTEIN: NEGATIVE mg/dL
Specific Gravity, Urine: 1.02 (ref 1.005–1.030)
pH: 6 (ref 5.0–8.0)

## 2018-03-03 LAB — WET PREP, GENITAL
Clue Cells Wet Prep HPF POC: NONE SEEN
Sperm: NONE SEEN
Trich, Wet Prep: NONE SEEN
Yeast Wet Prep HPF POC: NONE SEEN

## 2018-03-03 LAB — PREGNANCY, URINE: Preg Test, Ur: NEGATIVE

## 2018-03-03 MED ORDER — CEFTRIAXONE SODIUM 250 MG IJ SOLR
250.0000 mg | Freq: Once | INTRAMUSCULAR | Status: AC
  Administered 2018-03-03: 250 mg via INTRAMUSCULAR
  Filled 2018-03-03: qty 250

## 2018-03-03 MED ORDER — AZITHROMYCIN 250 MG PO TABS
1000.0000 mg | ORAL_TABLET | Freq: Once | ORAL | Status: AC
  Administered 2018-03-03: 1000 mg via ORAL
  Filled 2018-03-03: qty 4

## 2018-03-03 MED ORDER — LIDOCAINE HCL (PF) 1 % IJ SOLN
INTRAMUSCULAR | Status: AC
  Administered 2018-03-03: 1.2 mL
  Filled 2018-03-03: qty 5
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
## 2018-03-03 NOTE — ED Notes (Signed)
ED Provider at bedside. 

## 2018-03-03 NOTE — ED Notes (Signed)
Pt collected own vaginal swabs per [facility] from EDPA- instructions given and pt verbailzed understanding

## 2018-03-03 NOTE — Discharge Instructions (Signed)
You have been treated for gonorrhea and chlamydia today. You will be called in 3 days if any of your tests return positive. In that case, please make all of your sexual partners aware that they will need to be treated as well. Abstain from intercourse for one week until you have both been treated. Use condoms in the future to help prevent sexually transmitted disease and unwanted pregnancy. You can go to the health department in the future for free STD testing.  A urine culture will be sent of your urine.  If another antibiotic is required, you will be called.

## 2018-03-03 NOTE — ED Triage Notes (Signed)
Pr states her friend just got treated for STDs (no results yet) and she wants treatment because they had sex 2-3 days ago. Denies Sx

## 2018-03-04 LAB — GC/CHLAMYDIA PROBE AMP (~~LOC~~) NOT AT ARMC
Chlamydia: NEGATIVE
Neisseria Gonorrhea: NEGATIVE

## 2018-03-05 LAB — URINE CULTURE

## 2018-04-07 DIAGNOSIS — F419 Anxiety disorder, unspecified: Secondary | ICD-10-CM | POA: Insufficient documentation

## 2018-04-07 DIAGNOSIS — D573 Sickle-cell trait: Secondary | ICD-10-CM | POA: Insufficient documentation

## 2018-04-21 ENCOUNTER — Encounter (HOSPITAL_BASED_OUTPATIENT_CLINIC_OR_DEPARTMENT_OTHER): Payer: Self-pay | Admitting: Emergency Medicine

## 2018-04-21 ENCOUNTER — Other Ambulatory Visit: Payer: Self-pay

## 2018-04-21 DIAGNOSIS — W260XXA Contact with knife, initial encounter: Secondary | ICD-10-CM | POA: Insufficient documentation

## 2018-04-21 DIAGNOSIS — Y999 Unspecified external cause status: Secondary | ICD-10-CM | POA: Insufficient documentation

## 2018-04-21 DIAGNOSIS — Y929 Unspecified place or not applicable: Secondary | ICD-10-CM | POA: Diagnosis not present

## 2018-04-21 DIAGNOSIS — Z5321 Procedure and treatment not carried out due to patient leaving prior to being seen by health care provider: Secondary | ICD-10-CM | POA: Insufficient documentation

## 2018-04-21 DIAGNOSIS — Y939 Activity, unspecified: Secondary | ICD-10-CM | POA: Insufficient documentation

## 2018-04-21 DIAGNOSIS — S61412A Laceration without foreign body of left hand, initial encounter: Secondary | ICD-10-CM | POA: Diagnosis not present
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
## 2018-04-22 ENCOUNTER — Emergency Department (HOSPITAL_BASED_OUTPATIENT_CLINIC_OR_DEPARTMENT_OTHER)
Admission: EM | Admit: 2018-04-22 | Discharge: 2018-04-22 | Disposition: A | Discharge status: Home / Self Care | Payer: 59 | Attending: Emergency Medicine | Admitting: Emergency Medicine

## 2018-04-22 NOTE — ED Notes (Signed)
RN brought juice, crackers, stickers and coloring sheets for pt's child. Pt denies any needs at this time. Pt also notified that there is one doctor working this facility, and she would be around as quickly as possible.

## 2018-04-22 NOTE — ED Notes (Signed)
Pt eloped.

## 2019-04-28 ENCOUNTER — Emergency Department (HOSPITAL_BASED_OUTPATIENT_CLINIC_OR_DEPARTMENT_OTHER)
Admission: EM | Admit: 2019-04-28 | Discharge: 2019-04-28 | Disposition: A | Discharge status: Home / Self Care | Payer: 59 | Attending: Emergency Medicine | Admitting: Emergency Medicine

## 2019-04-28 ENCOUNTER — Encounter (HOSPITAL_BASED_OUTPATIENT_CLINIC_OR_DEPARTMENT_OTHER): Payer: Self-pay | Admitting: *Deleted

## 2019-04-28 ENCOUNTER — Other Ambulatory Visit: Payer: Self-pay

## 2019-04-28 DIAGNOSIS — Z87891 Personal history of nicotine dependence: Secondary | ICD-10-CM | POA: Insufficient documentation

## 2019-04-28 DIAGNOSIS — N3001 Acute cystitis with hematuria: Secondary | ICD-10-CM | POA: Insufficient documentation

## 2019-04-28 LAB — URINALYSIS, ROUTINE W REFLEX MICROSCOPIC
Bilirubin Urine: NEGATIVE
Glucose, UA: NEGATIVE mg/dL
Ketones, ur: NEGATIVE mg/dL
Nitrite: NEGATIVE
Protein, ur: NEGATIVE mg/dL
Specific Gravity, Urine: 1.025 (ref 1.005–1.030)
pH: 6 (ref 5.0–8.0)

## 2019-04-28 LAB — CBG MONITORING, ED: Glucose-Capillary: 74 mg/dL (ref 70–99)

## 2019-04-28 LAB — URINALYSIS, MICROSCOPIC (REFLEX)

## 2019-04-28 LAB — PREGNANCY, URINE: Preg Test, Ur: NEGATIVE

## 2019-04-28 NOTE — ED Notes (Signed)
Pt not in room at this time

## 2019-04-28 NOTE — ED Provider Notes (Signed)
[facility] EMERGENCY DEPARTMENT Provider Note   CSN: [id number] Arrival date & time: 04/28/19  1920     History Chief Complaint  Patient presents with  . Urinary Frequency    Rebecca Delgado is a 24 y.o. female who presents for 1 week of urinary frequency, hematuria.  Patient states she is also had some lower back pain.  Additionally, she reports for the last week, she has had itchiness to bilateral breast.  Patient reports that she has had increased urinary frequency but no dysuria.  She also has noted some hematuria.  She states she mostly noticed notices when she wipes.  She states she is not having any vaginal bleeding and states that when she is not going to the bathroom, she does not have to use a pad and has no bleeding in her underwear.  Patient states she has not had any vaginal discharge.  She states that the lower back pain occurs intermittently.  No preceding trauma or injury.  She denies any abdominal pain.  Patient also reports that for the last week or so, she has had itching to bilateral breast.  She has not noticed any overlying warmth, erythema, rash.  She does states she has a history of eczema.  She has not use any medications.  She has not noticed any double nipple discharge, palpable masses to her breast.  Patient denies any chest pain, difficulty breathing, vaginal discharge.  She is sexually active with one partner.  They do not use protection.    The history is provided by the patient.       Past Medical History:  Diagnosis Date  . Sickle cell trait (HCC)     There are no problems to display for this patient.   History reviewed. No pertinent surgical history.   OB History    Gravida  1   Para      Term      Preterm      AB      Living        SAB      TAB      Ectopic      Multiple      Live Births              History reviewed. No pertinent family history.  Social History   Tobacco Use  . Smoking status: Former  Games developer  . Smokeless tobacco: Never Used  Substance Use Topics  . Alcohol use: No  . Drug use: No    Home Medications Prior to Admission medications   Medication Sig Start Date End Date Taking? Authorizing Provider  metroNIDAZOLE (FLAGYL) 500 MG tablet Take 1 tablet (500 mg total) by mouth 2 (two) times daily. One po bid x 7 days 10/17/17   Mesner, Barbara Cower, MD  ondansetron (ZOFRAN ODT) 4 MG disintegrating tablet Take 1 tablet (4 mg total) by mouth every 8 (eight) hours as needed. 01/30/18   Long, Arlyss Repress, MD    Allergies    Patient has no known allergies.  Review of Systems   Review of Systems  Constitutional: Negative for fever.  Respiratory: Negative for shortness of breath.   Cardiovascular: Negative for chest pain.  Gastrointestinal: Negative for abdominal pain, nausea and vomiting.  Genitourinary: Positive for frequency and hematuria. Negative for dysuria.  Musculoskeletal: Positive for back pain.  Skin:       Itching ot bilateral breasts  All other systems reviewed and are negative.   Physical Exam Updated  Vital Signs BP 117/88 (BP Location: Right Arm)   Pulse 85   Temp 98.4 F (36.9 C) (Oral)   Resp 16   Ht 5\' 6"  (1.676 m)   Wt 78.9 kg   LMP 04/20/2019   SpO2 100%   BMI 28.08 kg/m   Physical Exam Vitals and nursing note reviewed.  Constitutional:      Appearance: Normal appearance. She is well-developed.     Comments: Sitting comfortably on examination table  HENT:     Head: Normocephalic and atraumatic.  Eyes:     General: Lids are normal.     Conjunctiva/sclera: Conjunctivae normal.     Pupils: Pupils are equal, round, and reactive to light.  Cardiovascular:     Rate and Rhythm: Normal rate and regular rhythm.     Pulses: Normal pulses.     Heart sounds: Normal heart sounds. No murmur. No friction rub. No gallop.   Pulmonary:     Effort: Pulmonary effort is normal.     Breath sounds: Normal breath sounds.     Comments: Lungs clear to auscultation  bilaterally.  Symmetric chest rise.  No wheezing, rales, rhonchi. Chest:     Breasts:        Right: No swelling, bleeding, mass or nipple discharge.        Left: No swelling, bleeding, mass or nipple discharge.     Comments: The exam was performed with a chaperone present.  Bilateral breasts are without any erythema, edema, tenderness.  No palpable masses noted.  No inverted nipple noted bilaterally. Abdominal:     Palpations: Abdomen is soft. Abdomen is not rigid.     Tenderness: There is no abdominal tenderness. There is no guarding.     Comments: Abdomen is soft, non-distended, non-tender. No rigidity, No guarding. No peritoneal signs.  No Rigidity, guarding.  No CVA tenderness.  Musculoskeletal:        General: Normal range of motion.     Cervical back: Full passive range of motion without pain.       Back:     Comments: Diffuse tenderness noted to the entire lower lumbar region.  No deformity or crepitus noted.  Skin:    General: Skin is warm and dry.     Capillary Refill: Capillary refill takes less than 2 seconds.  Neurological:     Mental Status: She is alert and oriented to person, place, and time.  Psychiatric:        Speech: Speech normal.     ED Results / Procedures / Treatments   Labs (all labs ordered are listed, but only abnormal results are displayed) Labs Reviewed  URINALYSIS, ROUTINE W REFLEX MICROSCOPIC - Abnormal; Notable for the following components:      Result Value   Hgb urine dipstick TRACE (*)    Leukocytes,Ua TRACE (*)    All other components within normal limits  URINALYSIS, MICROSCOPIC (REFLEX) - Abnormal; Notable for the following components:   Bacteria, UA RARE (*)    All other components within normal limits  PREGNANCY, URINE  CBG MONITORING, ED  WET PREP  (BD AFFIRM) ([place])  GC/CHLAMYDIA PROBE AMP ([place]) NOT AT [facility]    EKG None  Radiology No results found.  Procedures Procedures (including critical care time)   Medications Ordered in ED Medications - No data to display  ED Course  I have reviewed the triage vital signs and the nursing notes.  Pertinent labs & imaging results that were available during  my care of the patient were reviewed by me and considered in my medical decision making (see chart for details).    MDM Rules/Calculators/A&P                       24 year old female who presents for evaluation of increased urinary frequency, hematuria x1 week.  Also reports some lower back pain.  Also reports that her bilateral breasts have been itching over the last week.  No fevers, nausea/vomiting, abdominal pain, vaginal discharge, vaginal bleeding.  On initial ED arrival, she is afebrile, nontoxic-appearing.  Vital signs are stable.  Exam, her abdomen is benign.  She has some mild tenderness noted to the lower lumbar region.  This is more consistent with musculoskeletal strain rather than true CVA tenderness.  Consider UTI.  Urine ordered at triage.  UA shows trace hemoglobin, leukocytes, bacteria.  Given that she is symptomatic, plan to treat.  Urine pregnancy negative.  Patient discussed with me that her friend had similar symptoms and she was diagnosed with trichomonas.  Patient has not been having any discharge.  I offered to do pelvic exam with wet prep, gonorrhea/chlamydia.  Patient does not wish to have the pelvic at this time.  She would like to do the self swabs.  RN inform me that patient eloped prior to completion of treatment.  Portions of this note were generated with Scientist, clinical (histocompatibility and immunogenetics). Dictation errors may occur despite best attempts at proofreading.   Final Clinical Impression(s) / ED Diagnoses Final diagnoses:  Acute cystitis with hematuria    Rx / DC Orders ED Discharge Orders    None       Maxwell Caul, PA-C 04/29/19 0015    Little, Ambrose Finland, MD 05/01/19 [phone]

## 2019-04-28 NOTE — ED Triage Notes (Signed)
Urinary frequency, bilateral lower back pain x 1 week. Also reports vaginal spotting.

## 2019-04-28 NOTE — ED Notes (Signed)
PT still not in room

## 2019-04-28 NOTE — ED Notes (Signed)
PT asking how long till she will be ready to leave. Stated theres no way to tell since she has not been assessed by the provider yet. PT asked to change into gown for further evaluation.

## 2020-02-29 ENCOUNTER — Emergency Department (HOSPITAL_BASED_OUTPATIENT_CLINIC_OR_DEPARTMENT_OTHER)
Admission: EM | Admit: 2020-02-29 | Discharge: 2020-02-29 | Disposition: A | Discharge status: Home / Self Care | Payer: Medicaid Other | Attending: Emergency Medicine | Admitting: Emergency Medicine

## 2020-02-29 ENCOUNTER — Other Ambulatory Visit: Payer: Self-pay

## 2020-02-29 ENCOUNTER — Encounter (HOSPITAL_BASED_OUTPATIENT_CLINIC_OR_DEPARTMENT_OTHER): Payer: Self-pay | Admitting: Emergency Medicine

## 2020-02-29 DIAGNOSIS — R350 Frequency of micturition: Secondary | ICD-10-CM | POA: Insufficient documentation

## 2020-02-29 DIAGNOSIS — R109 Unspecified abdominal pain: Secondary | ICD-10-CM | POA: Insufficient documentation

## 2020-02-29 DIAGNOSIS — Z5321 Procedure and treatment not carried out due to patient leaving prior to being seen by health care provider: Secondary | ICD-10-CM | POA: Insufficient documentation

## 2020-02-29 LAB — BASIC METABOLIC PANEL
Anion gap: 9 (ref 5–15)
BUN: 10 mg/dL (ref 6–20)
CO2: 22 mmol/L (ref 22–32)
Calcium: 9 mg/dL (ref 8.9–10.3)
Chloride: 103 mmol/L (ref 98–111)
Creatinine, Ser: 0.75 mg/dL (ref 0.44–1.00)
GFR, Estimated: >60 mL/min (ref >60)
Glucose, Bld: 148 mg/dL — ABNORMAL HIGH (ref 70–99)
Potassium: 3.3 mmol/L — ABNORMAL LOW (ref 3.5–5.1)
Sodium: 134 mmol/L — ABNORMAL LOW (ref 135–145)

## 2020-02-29 LAB — URINALYSIS, ROUTINE W REFLEX MICROSCOPIC
Bilirubin Urine: NEGATIVE
Glucose, UA: NEGATIVE mg/dL
Hgb urine dipstick: NEGATIVE
Ketones, ur: NEGATIVE mg/dL
Leukocytes,Ua: NEGATIVE
Nitrite: NEGATIVE
Protein, ur: NEGATIVE mg/dL
Specific Gravity, Urine: 1.02 (ref 1.005–1.030)
pH: 6 (ref 5.0–8.0)

## 2020-02-29 LAB — CBC
HCT: 35 % — ABNORMAL LOW (ref 36.0–46.0)
Hemoglobin: 12.3 g/dL (ref 12.0–15.0)
MCH: 29.1 pg (ref 26.0–34.0)
MCHC: 35.1 g/dL (ref 30.0–36.0)
MCV: 82.9 fL (ref 80.0–100.0)
Platelets: 234 10*3/uL (ref 150–400)
RBC: 4.22 MIL/uL (ref 3.87–5.11)
RDW: 13.3 % (ref 11.5–15.5)
WBC: 6.7 10*3/uL (ref 4.0–10.5)
nRBC: 0 % (ref 0.0–0.2)

## 2020-02-29 LAB — PREGNANCY, URINE: Preg Test, Ur: POSITIVE — AB

## 2020-02-29 NOTE — ED Triage Notes (Signed)
Pt c/o abdominal cramping, urinary frequency and spotting onset Wednesday. Pt has not been seen by medical provider for symptoms.

## 2020-02-29 NOTE — ED Notes (Signed)
Pt called for to redo vitals, pt not present.

## 2020-02-29 NOTE — ED Notes (Signed)
NO answer to name called

## 2020-03-30 ENCOUNTER — Encounter: Payer: Medicaid Other | Admitting: Advanced Practice Midwife

## 2020-07-14 DIAGNOSIS — M5431 Sciatica, right side: Secondary | ICD-10-CM | POA: Insufficient documentation

## 2021-08-13 ENCOUNTER — Other Ambulatory Visit: Payer: Self-pay

## 2021-08-13 ENCOUNTER — Encounter (HOSPITAL_BASED_OUTPATIENT_CLINIC_OR_DEPARTMENT_OTHER): Payer: Self-pay | Admitting: Emergency Medicine

## 2021-08-13 ENCOUNTER — Emergency Department (HOSPITAL_BASED_OUTPATIENT_CLINIC_OR_DEPARTMENT_OTHER)
Admission: EM | Admit: 2021-08-13 | Discharge: 2021-08-13 | Disposition: A | Discharge status: Home / Self Care | Payer: Medicaid Other | Attending: Emergency Medicine | Admitting: Emergency Medicine

## 2021-08-13 DIAGNOSIS — Z87891 Personal history of nicotine dependence: Secondary | ICD-10-CM | POA: Insufficient documentation

## 2021-08-13 DIAGNOSIS — K6289 Other specified diseases of anus and rectum: Secondary | ICD-10-CM | POA: Diagnosis not present

## 2021-08-13 DIAGNOSIS — N3000 Acute cystitis without hematuria: Secondary | ICD-10-CM | POA: Diagnosis not present

## 2021-08-13 DIAGNOSIS — K5900 Constipation, unspecified: Secondary | ICD-10-CM | POA: Insufficient documentation

## 2021-08-13 DIAGNOSIS — R109 Unspecified abdominal pain: Secondary | ICD-10-CM | POA: Diagnosis present

## 2021-08-13 LAB — CBC WITH DIFFERENTIAL/PLATELET
Abs Immature Granulocytes: 0.03 10*3/uL (ref 0.00–0.07)
Basophils Absolute: 0 10*3/uL (ref 0.0–0.1)
Basophils Relative: 1 %
Eosinophils Absolute: 0.1 10*3/uL (ref 0.0–0.5)
Eosinophils Relative: 1 %
HCT: 34.1 % — ABNORMAL LOW (ref 36.0–46.0)
Hemoglobin: 11.9 g/dL — ABNORMAL LOW (ref 12.0–15.0)
Immature Granulocytes: 0 %
Lymphocytes Relative: 27 %
Lymphs Abs: 2.1 10*3/uL (ref 0.7–4.0)
MCH: 29.2 pg (ref 26.0–34.0)
MCHC: 34.9 g/dL (ref 30.0–36.0)
MCV: 83.8 fL (ref 80.0–100.0)
Monocytes Absolute: 0.7 10*3/uL (ref 0.1–1.0)
Monocytes Relative: 9 %
Neutro Abs: 4.8 10*3/uL (ref 1.7–7.7)
Neutrophils Relative %: 62 %
Platelets: 240 10*3/uL (ref 150–400)
RBC: 4.07 MIL/uL (ref 3.87–5.11)
RDW: 13.3 % (ref 11.5–15.5)
WBC: 7.8 10*3/uL (ref 4.0–10.5)
nRBC: 0 % (ref 0.0–0.2)

## 2021-08-13 LAB — COMPREHENSIVE METABOLIC PANEL
ALT: 39 U/L (ref 0–44)
AST: 28 U/L (ref 15–41)
Albumin: 3.8 g/dL (ref 3.5–5.0)
Alkaline Phosphatase: 113 U/L (ref 38–126)
Anion gap: 5 (ref 5–15)
BUN: 9 mg/dL (ref 6–20)
CO2: 26 mmol/L (ref 22–32)
Calcium: 8.8 mg/dL — ABNORMAL LOW (ref 8.9–10.3)
Chloride: 105 mmol/L (ref 98–111)
Creatinine, Ser: 0.8 mg/dL (ref 0.44–1.00)
GFR, Estimated: >60 mL/min (ref >60)
Glucose, Bld: 84 mg/dL (ref 70–99)
Potassium: 3.9 mmol/L (ref 3.5–5.1)
Sodium: 136 mmol/L (ref 135–145)
Total Bilirubin: 0.8 mg/dL (ref 0.3–1.2)
Total Protein: 7.9 g/dL (ref 6.5–8.1)

## 2021-08-13 LAB — LIPASE, BLOOD: Lipase: 35 U/L (ref 11–51)

## 2021-08-13 LAB — URINALYSIS, MICROSCOPIC (REFLEX)

## 2021-08-13 LAB — URINALYSIS, ROUTINE W REFLEX MICROSCOPIC
Bilirubin Urine: NEGATIVE
Glucose, UA: NEGATIVE mg/dL
Ketones, ur: NEGATIVE mg/dL
Nitrite: NEGATIVE
Protein, ur: NEGATIVE mg/dL
Specific Gravity, Urine: 1.015 (ref 1.005–1.030)
pH: 7 (ref 5.0–8.0)

## 2021-08-13 LAB — PREGNANCY, URINE: Preg Test, Ur: NEGATIVE

## 2021-08-13 MED ORDER — NITROFURANTOIN MONOHYD MACRO 100 MG PO CAPS
100.0000 mg | ORAL_CAPSULE | Freq: Two times a day (BID) | ORAL | 0 refills | Status: DC
Start: 2021-08-13 — End: 2023-06-22

## 2021-08-13 NOTE — Discharge Instructions (Addendum)
You were diagnosed today with an early urinary tract infection.  I sent you in an antibiotic called Macrobid that you can take twice daily for 5 days.  You may also pick up a medication called as which you can pick up over-the-counter.  This often helps with the symptoms of increased frequency.  Please do not take this for more than 2 days and know that it can cause your urine to turn orange.  Additionally, I think that some of your discomfort is caused by residual constipation from your trip last week.  You can take MiraLAX, up to several doses at one time in order to produce a normal bowel movement.  Once you have a normal bowel movement, you can scale back on the amount of MiraLAX that you are taking.  If your symptoms do not improve after you complete all the antibiotics, please follow-up with your PCP.

## 2021-08-13 NOTE — ED Provider Notes (Signed)
[facility] EMERGENCY DEPARTMENT Provider Note   CSN: [id number] Arrival date & time: 08/13/21  1302     History  Chief Complaint  Patient presents with   Abdominal Pain    Rebecca Delgado is a 26 y.o. female who presents to the ED for evaluation of abdominal pain, rectal pain with defecation and increased urinary frequency x1 week.  Patient states that 1 week ago she went to [place] with her friends for her birthday.  She had a night of drinking and then symptoms were present upon waking.  She notes that she was quite constipated on that trip and did not have a bowel movement in the entire time.  Since then she has had 2 bowel movements with small, hard stools that caused rectal pain when passing.  She has taken 2 doses of MiraLAX without improvement in symptoms.  No blood or tarry stools.  She also notes that since her trip, she has had increased urinary frequency and discomfort.  Denies nausea, vomiting, hematuria, vaginal pain, vaginal discharge and fevers.   Abdominal Pain      Home Medications Prior to Admission medications   Medication Sig Start Date End Date Taking? Authorizing Provider  nitrofurantoin, macrocrystal-monohydrate, (MACROBID) 100 MG capsule Take 1 capsule (100 mg total) by mouth 2 (two) times daily. 08/13/21  Yes Raynald Blend R, PA-C  metroNIDAZOLE (FLAGYL) 500 MG tablet Take 1 tablet (500 mg total) by mouth 2 (two) times daily. One po bid x 7 days 10/17/17   Mesner, Barbara Cower, MD  ondansetron (ZOFRAN ODT) 4 MG disintegrating tablet Take 1 tablet (4 mg total) by mouth every 8 (eight) hours as needed. 01/30/18   Long, Arlyss Repress, MD      Allergies    Patient has no known allergies.    Review of Systems   Review of Systems  Gastrointestinal:  Positive for abdominal pain.    Physical Exam Updated Vital Signs BP 121/85   Pulse 73   Temp 97.9 F (36.6 C) (Oral)   Resp 16   LMP 07/18/2021 (Exact Date)   SpO2 100%  Physical Exam Vitals and nursing  note reviewed.  Constitutional:      General: She is not in acute distress.    Appearance: She is not ill-appearing.  HENT:     Head: Atraumatic.  Eyes:     Conjunctiva/sclera: Conjunctivae normal.  Cardiovascular:     Rate and Rhythm: Normal rate and regular rhythm.     Pulses: Normal pulses.     Heart sounds: No murmur heard. Pulmonary:     Effort: Pulmonary effort is normal. No respiratory distress.     Breath sounds: Normal breath sounds.  Abdominal:     General: Abdomen is flat. There is no distension.     Palpations: Abdomen is soft.     Tenderness: There is abdominal tenderness.     Comments: Suprapubic tenderness bilaterally, notes that pressing feels like "pressure" and makes her feel the urge to urinate.  Otherwise soft, nondistended  Genitourinary:    Comments: No internal or external hemorrhoids noted, no fissures noted Musculoskeletal:        General: Normal range of motion.     Cervical back: Normal range of motion.  Skin:    General: Skin is warm and dry.     Capillary Refill: Capillary refill takes less than 2 seconds.  Neurological:     General: No focal deficit present.     Mental Status: She is alert.  Psychiatric:        Mood and Affect: Mood normal.     ED Results / Procedures / Treatments   Labs (all labs ordered are listed, but only abnormal results are displayed) Labs Reviewed  COMPREHENSIVE METABOLIC PANEL - Abnormal; Notable for the following components:      Result Value   Calcium 8.8 (*)    All other components within normal limits  URINALYSIS, ROUTINE W REFLEX MICROSCOPIC - Abnormal; Notable for the following components:   Hgb urine dipstick TRACE (*)    Leukocytes,Ua TRACE (*)    All other components within normal limits  CBC WITH DIFFERENTIAL/PLATELET - Abnormal; Notable for the following components:   Hemoglobin 11.9 (*)    HCT 34.1 (*)    All other components within normal limits  URINALYSIS, MICROSCOPIC (REFLEX) - Abnormal; Notable  for the following components:   Bacteria, UA FEW (*)    All other components within normal limits  LIPASE, BLOOD  PREGNANCY, URINE    EKG None  Radiology No results found.  Procedures Procedures    Medications Ordered in ED Medications - No data to display  ED Course/ Medical Decision Making/ A&P                           Medical Decision Making Amount and/or Complexity of Data Reviewed Labs: ordered.  Risk Prescription drug management.   Social determinants of health:  Social History   Socioeconomic History   Marital status: Single    Spouse name: Not on file   Number of children: Not on file   Years of education: Not on file   Highest education level: Not on file  Occupational History   Not on file  Tobacco Use   Smoking status: Former   Smokeless tobacco: Never  Vaping Use   Vaping Use: Never used  Substance and Sexual Activity   Alcohol use: No   Drug use: No   Sexual activity: Yes    Birth control/protection: None  Other Topics Concern   Not on file  Social History Narrative   Not on file   Social Determinants of Health   Financial Resource Strain: Not on file  Food Insecurity: Not on file  Transportation Needs: Not on file  Physical Activity: Not on file  Stress: Not on file  Social Connections: Not on file  Intimate Partner Violence: Not on file     Initial impression:  This patient presents to the ED for concern of abdominal pain, this involves an extensive number of treatment options, and is a complaint that carries with it a high risk of complications and morbidity.   Differentials include UTI, STD, vaginal infection, constipation, gastroenteritis, pancreatitis  Comorbidities affecting care:  None  Additional history obtained: Nursing note reviewed  Lab Tests  I Ordered, reviewed, and interpreted labs and EKG.  The pertinent results include:  CMP unremarkable No leukocytosis Mild infection noted on UA Lipase normal  ED  Course/Re-evaluation: History and physical exam as described above. Patient states she has been in a monogamous relationship for 2 years and symptoms are not consistent with STD or vaginal infection.  I do suspect that she has some degree of constipation from her trip last weekend.  We discussed use of MiraLAX and ability to titrate dosage up and down until desired effect.  Rectal exam reassuring for no hemorrhoids.  Patient expresses understanding regarding this.  As for her urinary frequency and discomfort,  she does seem to have a very mild urinary tract infection which could be causing her symptoms.  We will plan to discharge home with Macrobid x5 days.  Return precautions discussed.  If symptoms do not improve, she is to follow-up with her PCP for reevaluation.  She is understanding and amenable to plan.  Disposition:  After consideration of the diagnostic results, physical exam, history and the patients response to treatment feel that the patent would benefit from discharge.   Constipation Acute cystitis without hematuria: Plan and management as described above. Discharged home in good condition.  Final Clinical Impression(s) / ED Diagnoses Final diagnoses:  Constipation, unspecified constipation type  Acute cystitis without hematuria    Rx / DC Orders ED Discharge Orders          Ordered    nitrofurantoin, macrocrystal-monohydrate, (MACROBID) 100 MG capsule  2 times daily        08/13/21 1446              Janell Quiet, [place] 08/13/21 1933    Alvira Monday, MD 08/14/21 2128

## 2021-08-13 NOTE — ED Triage Notes (Addendum)
Pt c/o lower abdominal pain and rectal pain x 1 week. Reports pain resolved, but came back today. Last BM yesterday. Describes pain as "contractions" Also endorses urinary frequency. Denies vaginal discharge.

## 2022-02-02 DIAGNOSIS — Z975 Presence of (intrauterine) contraceptive device: Secondary | ICD-10-CM | POA: Insufficient documentation

## 2022-02-10 DIAGNOSIS — R87612 Low grade squamous intraepithelial lesion on cytologic smear of cervix (LGSIL): Secondary | ICD-10-CM | POA: Insufficient documentation

## 2023-02-01 LAB — RESULTS CONSOLE HPV: CHL HPV: POSITIVE

## 2023-02-01 LAB — HM PAP SMEAR: HM Pap smear: ABNORMAL

## 2023-06-22 ENCOUNTER — Encounter: Payer: Self-pay | Admitting: Family Medicine

## 2023-06-22 ENCOUNTER — Ambulatory Visit (INDEPENDENT_AMBULATORY_CARE_PROVIDER_SITE_OTHER): Admitting: Family Medicine

## 2023-06-22 VITALS — BP 123/83 | HR 80 | Temp 98.2°F | Resp 18 | Ht 66.0 in | Wt 194.8 lb

## 2023-06-22 DIAGNOSIS — R5383 Other fatigue: Secondary | ICD-10-CM | POA: Diagnosis not present

## 2023-06-22 DIAGNOSIS — Z6831 Body mass index (BMI) 31.0-31.9, adult: Secondary | ICD-10-CM

## 2023-06-22 DIAGNOSIS — G43009 Migraine without aura, not intractable, without status migrainosus: Secondary | ICD-10-CM | POA: Diagnosis not present

## 2023-06-22 DIAGNOSIS — E559 Vitamin D deficiency, unspecified: Secondary | ICD-10-CM

## 2023-06-22 DIAGNOSIS — E66811 Obesity, class 1: Secondary | ICD-10-CM

## 2023-06-22 DIAGNOSIS — F419 Anxiety disorder, unspecified: Secondary | ICD-10-CM

## 2023-06-22 DIAGNOSIS — Z0000 Encounter for general adult medical examination without abnormal findings: Secondary | ICD-10-CM | POA: Diagnosis not present

## 2023-06-22 DIAGNOSIS — F32A Depression, unspecified: Secondary | ICD-10-CM

## 2023-06-22 LAB — VITAMIN D 25 HYDROXY (VIT D DEFICIENCY, FRACTURES): VITD: 21.01 ng/mL — ABNORMAL LOW (ref 30.00–100.00)

## 2023-06-22 LAB — CBC WITH DIFFERENTIAL/PLATELET
Basophils Absolute: 0 10*3/uL (ref 0.0–0.1)
Basophils Relative: 0.6 % (ref 0.0–3.0)
Eosinophils Absolute: 0.1 10*3/uL (ref 0.0–0.7)
Eosinophils Relative: 1.7 % (ref 0.0–5.0)
HCT: 36.7 % (ref 36.0–46.0)
Hemoglobin: 12.2 g/dL (ref 12.0–15.0)
Lymphocytes Relative: 32.8 % (ref 12.0–46.0)
Lymphs Abs: 2.4 10*3/uL (ref 0.7–4.0)
MCHC: 33.3 g/dL (ref 30.0–36.0)
MCV: 84.1 fl (ref 78.0–100.0)
Monocytes Absolute: 0.6 10*3/uL (ref 0.1–1.0)
Monocytes Relative: 8.7 % (ref 3.0–12.0)
Neutro Abs: 4.2 10*3/uL (ref 1.4–7.7)
Neutrophils Relative %: 56.2 % (ref 43.0–77.0)
Platelets: 261 10*3/uL (ref 150.0–400.0)
RBC: 4.36 Mil/uL (ref 3.87–5.11)
RDW: 13.5 % (ref 11.5–15.5)
WBC: 7.4 10*3/uL (ref 4.0–10.5)

## 2023-06-22 LAB — COMPREHENSIVE METABOLIC PANEL WITH GFR
ALT: 26 U/L (ref 0–35)
AST: 23 U/L (ref 0–37)
Albumin: 4.2 g/dL (ref 3.5–5.2)
Alkaline Phosphatase: 109 U/L (ref 39–117)
BUN: 6 mg/dL (ref 6–23)
CO2: 27 meq/L (ref 19–32)
Calcium: 8.7 mg/dL (ref 8.4–10.5)
Chloride: 103 meq/L (ref 96–112)
Creatinine, Ser: 0.64 mg/dL (ref 0.40–1.20)
GFR: 119.04 mL/min (ref >60.00)
Glucose, Bld: 73 mg/dL (ref 70–99)
Potassium: 4 meq/L (ref 3.5–5.1)
Sodium: 138 meq/L (ref 135–145)
Total Bilirubin: 0.4 mg/dL (ref 0.2–1.2)
Total Protein: 7.6 g/dL (ref 6.0–8.3)

## 2023-06-22 LAB — LIPID PANEL
Cholesterol: 183 mg/dL (ref 0–200)
HDL: 39.7 mg/dL (ref >39.00)
LDL Cholesterol: 122 mg/dL — ABNORMAL HIGH (ref 0–99)
NonHDL: 143.15
Total CHOL/HDL Ratio: 5
Triglycerides: 105 mg/dL (ref 0.0–149.0)
VLDL: 21 mg/dL (ref 0.0–40.0)

## 2023-06-22 LAB — IBC + FERRITIN
Ferritin: 37.4 ng/mL (ref 10.0–291.0)
Iron: 57 ug/dL (ref 42–145)
Saturation Ratios: 13.5 % — ABNORMAL LOW (ref 20.0–50.0)
TIBC: 422.8 ug/dL (ref 250.0–450.0)
Transferrin: 302 mg/dL (ref 212.0–360.0)

## 2023-06-22 LAB — B12 AND FOLATE PANEL
Folate: 13.8 ng/mL (ref >5.9)
Vitamin B-12: 506 pg/mL (ref 211–911)

## 2023-06-22 LAB — TSH: TSH: 1.74 u[IU]/mL (ref 0.35–5.50)

## 2023-06-22 MED ORDER — SUMATRIPTAN SUCCINATE 50 MG PO TABS: 50.0000 mg | ORAL_TABLET | Freq: Once | ORAL | 0 refills | Status: DC

## 2023-06-22 MED ORDER — BUPROPION HCL ER (XL) 150 MG PO TB24: 150.0000 mg | ORAL_TABLET | Freq: Every day | ORAL | 3 refills | Status: AC

## 2023-06-22 NOTE — Progress Notes (Addendum)
 New Patient Office Visit  Subjective:     Patient ID: Rebecca Delgado, female    DOB: Mar 24, 1995, 28 y.o.   MRN: [id number]  Chief Complaint  Patient presents with   New Patient (Initial Visit)    Concerns/ questions: 1. depression/ Anxiety. 2. Weight Previous PCP: [facility] (will no longer go to this office)    HPI Patient is in today for establish care appointment She lives with her kids - 60 and 19 year old boys.  She works in Clinical biochemist call center.    Discussed the use of AI scribe software for clinical note transcription with the patient, who gave verbal consent to proceed.  History of Present Illness Rebecca Delgado is a 28 year old female who presents with significant weight gain and associated symptoms.  She has experienced significant weight gain since using Nexplanon for three to four months, starting in November 2024, with her weight increasing from approximately 170 pounds to 194 pounds. Despite removing Nexplanon in January 2025 due to rapid weight gain and persistent bleeding, she continues to gain weight. This has led to shortness of breath with exertion, such as walking up stairs, and feelings of depression, impacting her ability to engage with her children.  She experiences migraines that began with the use of Nexplanon. These migraines are right-sided, lasting for several days, and are associated with nausea and sensitivity to light. She has been using Advil for relief but is concerned about rebound headaches from frequent use.  She reports extreme fatigue, which she attributes to low vitamin D levels identified in December 2024. She has been taking 2000 IU of vitamin D3 daily since then. She also mentions a history of anemia, possibly related to her sickle cell trait, and has not had her B12 or iron levels checked recently.  She has a history of bacterial vaginosis and trichomonas diagnosed in April 2025, for which she completed a seven-day course of  Flagyl . She reports resolution of symptoms but is unsure if retesting is necessary.  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 She does not consume alcohol, use drugs, or smoke, and is not currently sexually active.     Wt Readings from Last 3 Encounters:  06/22/23 194 lb 12.8 oz (88.4 kg)  02/29/20 186 lb (84.4 kg)  04/28/19 174 lb (78.9 kg)    Current diet/exercise plan:  Weekly Exercise Plan **4 times a week** Warm up: 30 min walk Cardio - Incline 12, speed 2 Core - [place] twist 2x20              Leg raises 3x15              Crunches 3x20 20 jumping hacks before every workout     Diet plan  (Intermittent fasting) Eating window 12-7   12:00pm Oatmeal and water with lemon 4:00pm Tuna salad  Kiwi and apple 6:00pm Salmon  Vegetable (broccoli, squash, zucchini)     ROS All review of systems negative except what is listed in the HPI      Objective:    BP 123/83   Pulse 80   Temp 98.2 F (36.8 C) (Oral)   Resp 18   Ht 5\' 6"  (1.676 m)   Wt 194 lb 12.8 oz (88.4 kg)   SpO2 100%   BMI 31.44 kg/m    Physical Exam Vitals reviewed.  Constitutional:      General:  She is not in acute distress.    Appearance: Normal appearance. She is obese. She is not ill-appearing.  Cardiovascular:     Rate and Rhythm: Normal rate and regular rhythm.     Heart sounds: Normal heart sounds.  Pulmonary:     Effort: Pulmonary effort is normal.     Breath sounds: Normal breath sounds. No wheezing, rhonchi or rales.  Musculoskeletal:     Right lower leg: No edema.     Left lower leg: No edema.  Skin:    General: Skin is warm and dry.  Neurological:     Mental Status: She is alert and oriented to person, place, and time.  Psychiatric:        Mood and Affect: Mood normal.        Behavior: Behavior normal.        Thought Content: Thought content normal.        Judgment: Judgment normal.         Results for orders placed or performed in visit on 06/22/23  HM PAP SMEAR  Result Value Ref Range   HM Pap smear abnormal   Results Console HPV  Result Value Ref Range   CHL HPV Positive         Assessment & Plan:   Problem List Items Addressed This Visit       Active Problems   Anxiety and depression   Depression with previous sertraline use discontinued due to side effects. Symptoms include low mood, lack of motivation. Discussed Wellbutrin for treatment and weight loss aid. - Prescribed Wellbutrin for depression and potential weight loss. - Reassess mood and weight in six weeks to evaluate Wellbutrin effectiveness.      Relevant Medications   buPROPion (WELLBUTRIN XL) 150 MG 24 hr tablet   Vitamin D deficiency   - Recheck vitamin D levels to assess need for prescription-strength supplementation.      Relevant Orders   VITAMIN D 25 Hydroxy (Vit-D Deficiency, Fractures)   Migraine without aura and without status migrainosus, not intractable   Migraines with right-sided pain, nausea, photophobia, exacerbated by computer use. Persist post-Nexplanon removal. Discussed sumatriptan for acute management. - Prescribed sumatriptan for acute migraine management, one pill at onset, second if needed after two hours, max two per day. - Recommended glasses with blue light filter to reduce migraine triggers. - Cautioned regarding potential for rebound-headaches from NSAID/APAP overuse      Relevant Medications   buPROPion (WELLBUTRIN XL) 150 MG 24 hr tablet   SUMAtriptan (IMITREX) 50 MG tablet   Class 1 obesity without serious comorbidity with body mass index (BMI) of 31.0 to 31.9 in adult   Obesity with BMI 31, post-Nexplanon weight gain, shortness of breath on exertion - feeling "out of shape". Depression and anxiety may affect weight management. Discussed Wellbutrin and weight loss medications pending insurance. - Prescribed Wellbutrin for mood and potential weight  loss. - Encouraged dietary modifications and regular exercise, walking at least five days a week. - Consider referral to weight loss clinic if no progress and insurance does not cover medications. - No alarming cardiopulmonary signs/symptoms but advised of things to monitor for.       Relevant Orders   Lipid panel   TSH   Other Visit Diagnoses       Fatigue, unspecified type    -  Primary   Relevant Orders   CBC with Differential/Platelet   Comprehensive metabolic panel with GFR   B12 and Folate Panel  IBC + Ferritin   TSH     Encounter for medical examination to establish care         Trichomoniasis treated with Flagyl  in April. Follow-up testing needed to confirm resolution. - Schedule retesting for trichomoniasis in July, three months post-treatment.    Meds ordered this encounter  Medications   buPROPion (WELLBUTRIN XL) 150 MG 24 hr tablet    Sig: Take 1 tablet (150 mg total) by mouth daily.    Dispense:  30 tablet    Refill:  3    Supervising Provider:   Randie Bustle A [[id number]]   SUMAtriptan (IMITREX) 50 MG tablet    Sig: Take 1 tablet (50 mg total) by mouth once for 1 dose. May repeat in 2 hours if headache persists or recurs.    Dispense:  10 tablet    Refill:  0    Supervising Provider:   Randie Bustle A [[id number]]    Return in about 6 weeks (around 08/03/2023) for mood follow-up.  Everlina Hock, NP

## 2023-06-22 NOTE — Assessment & Plan Note (Signed)
-   Recheck vitamin D levels to assess need for prescription-strength supplementation.

## 2023-06-22 NOTE — Patient Instructions (Signed)

## 2023-06-22 NOTE — Assessment & Plan Note (Signed)
 Depression with previous sertraline use discontinued due to side effects. Symptoms include low mood, lack of motivation. Discussed Wellbutrin for treatment and weight loss aid. - Prescribed Wellbutrin for depression and potential weight loss. - Reassess mood and weight in six weeks to evaluate Wellbutrin effectiveness.

## 2023-06-22 NOTE — Assessment & Plan Note (Signed)
 Migraines with right-sided pain, nausea, photophobia, exacerbated by computer use. Persist post-Nexplanon removal. Discussed sumatriptan for acute management. - Prescribed sumatriptan for acute migraine management, one pill at onset, second if needed after two hours, max two per day. - Recommended glasses with blue light filter to reduce migraine triggers. - Cautioned regarding potential for rebound-headaches from NSAID/APAP overuse

## 2023-06-22 NOTE — Assessment & Plan Note (Signed)
 Obesity with BMI 31, post-Nexplanon weight gain, shortness of breath on exertion - feeling "out of shape". Depression and anxiety may affect weight management. Discussed Wellbutrin and weight loss medications pending insurance. - Prescribed Wellbutrin for mood and potential weight loss. - Encouraged dietary modifications and regular exercise, walking at least five days a week. - Consider referral to weight loss clinic if no progress and insurance does not cover medications. - No alarming cardiopulmonary signs/symptoms but advised of things to monitor for.

## 2023-06-25 ENCOUNTER — Other Ambulatory Visit (HOSPITAL_COMMUNITY): Payer: Self-pay

## 2023-06-25 ENCOUNTER — Telehealth: Payer: Self-pay

## 2023-06-25 ENCOUNTER — Encounter: Payer: Self-pay | Admitting: Family Medicine

## 2023-06-25 DIAGNOSIS — E66811 Obesity, class 1: Secondary | ICD-10-CM

## 2023-06-25 MED ORDER — WEGOVY 0.25 MG/0.5ML ~~LOC~~ SOAJ: 0.2500 mg | SUBCUTANEOUS | 0 refills | Status: AC

## 2023-06-25 NOTE — Telephone Encounter (Signed)
 Pharmacy Patient Advocate Encounter   Received notification from CoverMyMeds that prior authorization for [facility] 0.25MG /0.5ML auto-injectors is required/requested.   Insurance verification completed.   The patient is insured through [facility] MEDICAID .   Per test claim: PA required; PA submitted to above mentioned insurance via CoverMyMeds Key/confirmation #/EOC [id number] Status is pending

## 2023-06-26 NOTE — Telephone Encounter (Signed)
 Pharmacy Patient Advocate Encounter  Received notification from [employer] that Prior Authorization for [facility] 0.25MG /0.5ML auto-injectors has been DENIED.  Full denial letter will be uploaded to the media tab. See denial reason below.     PA #/Case ID/Reference #: [id number]

## 2023-06-29 ENCOUNTER — Other Ambulatory Visit (HOSPITAL_COMMUNITY): Payer: Self-pay

## 2023-06-29 ENCOUNTER — Telehealth: Payer: Self-pay

## 2023-06-29 NOTE — Telephone Encounter (Signed)
 Pharmacy Patient Advocate Encounter  Received notification from [employer] that Prior Authorization for [employer]  has been APPROVED from 06/29/2023 to 12/30/2023. Ran test claim, Copay is $4.00. This test claim was processed through [facility]- copay amounts may vary at other pharmacies due to pharmacy/plan contracts, or as the patient moves through the different stages of their insurance plan.   PA #/Case ID/Reference #: [id number]

## 2023-06-29 NOTE — Telephone Encounter (Signed)
 PA request has been Submitted. New Encounter has been or will be created for follow up. For additional info see Pharmacy Prior Auth telephone encounter from 06-29-2023.

## 2023-06-29 NOTE — Telephone Encounter (Signed)
PA has been approved.  Thanks! 

## 2023-06-29 NOTE — Telephone Encounter (Signed)
 Pharmacy Patient Advocate Encounter   Received notification from Patient Advice Request messages that prior authorization for [employer]gov[employer]  0.25MG /0.5ML auto-injectors is required/requested.   Insurance verification completed.   The patient is insured through [facility] .   Per test claim: PA required; PA submitted to above mentioned insurance via CoverMyMeds Key/confirmation #/EOC [id number] Status is pending

## 2023-08-06 ENCOUNTER — Ambulatory Visit: Admitting: Family Medicine

## 2023-09-15 ENCOUNTER — Encounter: Payer: Self-pay | Admitting: Family Medicine

## 2023-12-13 ENCOUNTER — Other Ambulatory Visit (HOSPITAL_COMMUNITY): Payer: Self-pay

## 2024-02-22 ENCOUNTER — Other Ambulatory Visit: Payer: Self-pay | Admitting: Family Medicine

## 2024-02-22 DIAGNOSIS — Z6831 Body mass index (BMI) 31.0-31.9, adult: Secondary | ICD-10-CM

## 2024-03-13 ENCOUNTER — Other Ambulatory Visit: Payer: Self-pay | Admitting: Family Medicine

## 2024-03-13 DIAGNOSIS — G43009 Migraine without aura, not intractable, without status migrainosus: Secondary | ICD-10-CM

## 2024-03-13 MED ORDER — SUMATRIPTAN SUCCINATE 50 MG PO TABS: 50.0000 mg | ORAL_TABLET | ORAL | 0 refills | Status: AC | PRN

## 2024-03-13 NOTE — Telephone Encounter (Signed)
 Copied from CRM [id number]. Topic: Clinical - Medication Refill >> Mar 13, 2024 11:01 AM Alfonso HERO wrote: Medication: SUMAtriptan  (IMITREX ) 50 MG tablet  Has the patient contacted their pharmacy? Yes (Agent: If no, request that the patient contact the pharmacy for the refill. If patient does not wish to contact the pharmacy document the reason why and proceed with request.) (Agent: If yes, when and what did the pharmacy advise?)  This is the patient's preferred pharmacy:  [id number] - [place], [place] - [address] [facility] [address] [place] [address] Phone: [phone] Fax: [phone]   Is this the correct pharmacy for this prescription? Yes If no, delete pharmacy and type the correct one.   Has the prescription been filled recently? Yes  Is the patient out of the medication? Yes  Has the patient been seen for an appointment in the last year OR does the patient have an upcoming appointment? Yes  Can we respond through MyChart? Yes  Agent: Please be advised that Rx refills may take up to 3 business days. We ask that you follow-up with your pharmacy.
